# Patient Record
Sex: Male | Born: 1965 | Race: White | Marital: Married | Smoking: Never smoker
Health system: Southern US, Community
[De-identification: ages and names within clinical notes are randomized; demographics above are authoritative.]

## PROBLEM LIST (undated history)

## (undated) DIAGNOSIS — F319 Bipolar disorder, unspecified: Secondary | ICD-10-CM

## (undated) DIAGNOSIS — M25579 Pain in unspecified ankle and joints of unspecified foot: Secondary | ICD-10-CM

## (undated) DIAGNOSIS — I10 Essential (primary) hypertension: Secondary | ICD-10-CM

## (undated) DIAGNOSIS — E663 Overweight: Secondary | ICD-10-CM

## (undated) DIAGNOSIS — M25569 Pain in unspecified knee: Secondary | ICD-10-CM

## (undated) DIAGNOSIS — F32A Depression, unspecified: Secondary | ICD-10-CM

## (undated) DIAGNOSIS — N289 Disorder of kidney and ureter, unspecified: Secondary | ICD-10-CM

## (undated) DIAGNOSIS — K829 Disease of gallbladder, unspecified: Secondary | ICD-10-CM

## (undated) DIAGNOSIS — G43909 Migraine, unspecified, not intractable, without status migrainosus: Secondary | ICD-10-CM

## (undated) DIAGNOSIS — E119 Type 2 diabetes mellitus without complications: Secondary | ICD-10-CM

## (undated) DIAGNOSIS — F419 Anxiety disorder, unspecified: Secondary | ICD-10-CM

## (undated) DIAGNOSIS — G4730 Sleep apnea, unspecified: Secondary | ICD-10-CM

## (undated) DIAGNOSIS — M549 Dorsalgia, unspecified: Secondary | ICD-10-CM

## (undated) HISTORY — DX: Pain in unspecified ankle and joints of unspecified foot: M25.579

## (undated) HISTORY — DX: Sleep apnea, unspecified: G47.30

## (undated) HISTORY — DX: Type 2 diabetes mellitus without complications: E11.9

## (undated) HISTORY — PX: CHOLECYSTECTOMY: SHX55

## (undated) HISTORY — PX: CARPAL TUNNEL RELEASE: SHX101

## (undated) HISTORY — DX: Anxiety disorder, unspecified: F41.9

## (undated) HISTORY — DX: Bipolar disorder, unspecified: F31.9

## (undated) HISTORY — DX: Overweight: E66.3

## (undated) HISTORY — DX: Depression, unspecified: F32.A

## (undated) HISTORY — DX: Disorder of kidney and ureter, unspecified: N28.9

## (undated) HISTORY — DX: Pain in unspecified knee: M25.569

## (undated) HISTORY — DX: Dorsalgia, unspecified: M54.9

## (undated) HISTORY — DX: Essential (primary) hypertension: I10

## (undated) HISTORY — DX: Disease of gallbladder, unspecified: K82.9

## (undated) HISTORY — DX: Migraine, unspecified, not intractable, without status migrainosus: G43.909

---

## 2017-08-11 ENCOUNTER — Other Ambulatory Visit: Payer: Self-pay | Admitting: Orthopedic Surgery

## 2017-08-11 DIAGNOSIS — M545 Low back pain: Secondary | ICD-10-CM

## 2017-08-31 ENCOUNTER — Other Ambulatory Visit: Payer: Self-pay | Admitting: Orthopedic Surgery

## 2017-08-31 DIAGNOSIS — G8929 Other chronic pain: Secondary | ICD-10-CM

## 2017-08-31 DIAGNOSIS — Z77018 Contact with and (suspected) exposure to other hazardous metals: Secondary | ICD-10-CM

## 2017-08-31 DIAGNOSIS — M545 Low back pain: Principal | ICD-10-CM

## 2017-09-02 ENCOUNTER — Ambulatory Visit
Admission: RE | Admit: 2017-09-02 | Discharge: 2017-09-02 | Disposition: A | Discharge status: Home / Self Care | Payer: BLUE CROSS/BLUE SHIELD | Source: Ambulatory Visit | Attending: Orthopedic Surgery | Admitting: Orthopedic Surgery

## 2017-09-02 DIAGNOSIS — M545 Low back pain: Secondary | ICD-10-CM

## 2017-09-11 ENCOUNTER — Ambulatory Visit
Admission: RE | Admit: 2017-09-11 | Discharge: 2017-09-11 | Disposition: A | Discharge status: Home / Self Care | Payer: BLUE CROSS/BLUE SHIELD | Source: Ambulatory Visit | Attending: Orthopedic Surgery | Admitting: Orthopedic Surgery

## 2017-09-11 DIAGNOSIS — M545 Low back pain: Principal | ICD-10-CM

## 2017-09-11 DIAGNOSIS — G8929 Other chronic pain: Secondary | ICD-10-CM

## 2018-03-18 ENCOUNTER — Ambulatory Visit: Payer: BLUE CROSS/BLUE SHIELD | Admitting: Physician Assistant

## 2018-03-18 ENCOUNTER — Encounter: Payer: Self-pay | Admitting: Physician Assistant

## 2018-03-18 DIAGNOSIS — F319 Bipolar disorder, unspecified: Secondary | ICD-10-CM

## 2018-03-18 DIAGNOSIS — F411 Generalized anxiety disorder: Secondary | ICD-10-CM | POA: Diagnosis not present

## 2018-03-18 MED ORDER — CARBAMAZEPINE 200 MG PO TABS: 200.0000 mg | ORAL_TABLET | Freq: Two times a day (BID) | ORAL | 0 refills | Status: DC

## 2018-03-18 MED ORDER — GABAPENTIN 100 MG PO CAPS: 100.0000 mg | ORAL_CAPSULE | Freq: Three times a day (TID) | ORAL | 0 refills | Status: DC

## 2018-03-18 NOTE — Progress Notes (Signed)
[facility]  Patient ID: Gregory Howe,  MRN: [id number]  PCP: Joya [name]  Date of Evaluation: 03/18/2018 Time spent:25 minutes   HISTORY/CURRENT STATUS: HPIWent to hospital last week.Had been having increased SI. He and wife went on a cruise to [place] last and he didn't have fun because of all the 'pressure' in his head.  After they got home last week, he worked one day and then he went to ER in [place] for SI.  "That was a colossal mistake.  They kept me 27 hours and then sent me to [facility].  I was in hospital for 4 nights.They put me on Gabapentin and I feel a lot better.  The pain in my back is a lot better too. It had been hurting for months.  I felt like if I didn't go to the hospital, the urge of suicide was so bad, I would do it. The pressure of being mad had built up so bad, I couldn't deal with it. I'm not suicidal now. The dr up there kept me out of work till tomorrow I'm a little anxious about going back to work tomorrow, but [employer] ready. He feels that the pressure has been released.  Beginning group therapy really helped a lot.  He denies suicidal or homicidal thoughts at this time. He feels a little more able to enjoy things right now and his energy and motivation are better.  Is not isolating.  Not sleeping as much since he got home from the hospital, of course that is just been a few days. No increased energy with decreased need for sleep, no irritability or easy to anger, no grandiosity, no impulsivity or risky behavior, no increased libido, no increased spending. During the hospitalization he was also put on trazodone for sleep.  He states it has helped a lot but does not need it every night.  Also the Tegretol was increased to 200 mg twice a day.  (I reviewed our records and that is the dose I have documented anyway.  He states he is only been taken 300 mg total a day)    Individual Medical History/ Review of Systems: Changes? :Yes see above  Allergies:  Patient has no known allergies.  Current Medications:  Current Outpatient Medications:  .  gabapentin (NEURONTIN) 100 MG capsule, Take 1 capsule (100 mg total) by mouth 3 (three) times daily., Disp: 270 capsule, Rfl: 0 .  traZODone (DESYREL) 50 MG tablet, Take 50 mg by mouth at bedtime as needed., Disp: , Rfl:  .  ALPRAZolam (XANAX) 0.5 MG tablet, Take 0.5 mg by mouth 2 (two) times daily as needed for anxiety (1/1-1 po bid prn)., Disp: , Rfl:  .  carbamazepine (TEGRETOL) 200 MG tablet, Take 1 tablet (200 mg total) by mouth 2 (two) times daily., Disp: 180 tablet, Rfl: 0 Medication Side Effects: None  Family Medical/ Social History: Changes? No  MENTAL HEALTH EXAM:  There were no vitals taken for this visit.There is no height or weight on file to calculate BMI.  General Appearance: Well Groomed obese  Eye Contact:  Good  Speech:  Clear and Coherent  Volume:  Normal  Mood:  Depressed  Affect:  Appropriate  Thought Process:  Goal Directed  Orientation:  Full (Time, Place, and Person)  Thought Content: Logical   Suicidal Thoughts:  No  Homicidal Thoughts:  No  Memory:  Immediate  Judgement:  Good  Insight:  Good  Psychomotor Activity:  Normal  Concentration:  Concentration: Good  Recall:  Good  Fund of Knowledge: Good  Language: Good  Akathisia:  No  AIMS (if indicated): not done  Assets:  Desire for Improvement  ADL's:  Intact  Cognition: WNL  Prognosis:  Good    DIAGNOSES:    ICD-10-CM   1. Bipolar I disorder (HCC) F31.9   2. Generalized anxiety disorder F41.1     RECOMMENDATIONS: I spent 25 minutes with him in this encounter and at least 50% of that time in counseling reference diagnosis and treatment options. I am glad he is doing much better and glad he went to the hospital when needed. Continue Tegretol 200 mg 1 twice daily. Continue gabapentin 100 mg 1 3 times daily.  He is aware that we can increase the dose quite a bit if it stops working in the future, he  should let me know. Continue trazodone 50 mg nightly as needed. He has Xanax for as needed use but states he is not needing that. Requested records from hospital admission.  After I reviewed the records if there is no carbamazepine level, I will order that. Recommend psychotherapy Return in 4 weeks or sooner as needed.    Melony Overly, PA-C

## 2018-04-28 ENCOUNTER — Ambulatory Visit: Payer: BLUE CROSS/BLUE SHIELD | Admitting: Physician Assistant

## 2018-05-11 ENCOUNTER — Other Ambulatory Visit: Payer: Self-pay | Admitting: Physician Assistant

## 2018-05-13 ENCOUNTER — Encounter: Payer: Self-pay | Admitting: Emergency Medicine

## 2018-05-13 DIAGNOSIS — F319 Bipolar disorder, unspecified: Secondary | ICD-10-CM | POA: Insufficient documentation

## 2018-05-13 DIAGNOSIS — F419 Anxiety disorder, unspecified: Secondary | ICD-10-CM | POA: Insufficient documentation

## 2018-05-21 ENCOUNTER — Ambulatory Visit (INDEPENDENT_AMBULATORY_CARE_PROVIDER_SITE_OTHER): Payer: BLUE CROSS/BLUE SHIELD | Admitting: Physician Assistant

## 2018-05-21 ENCOUNTER — Encounter: Payer: Self-pay | Admitting: Physician Assistant

## 2018-05-21 DIAGNOSIS — F411 Generalized anxiety disorder: Secondary | ICD-10-CM

## 2018-05-21 DIAGNOSIS — F319 Bipolar disorder, unspecified: Secondary | ICD-10-CM

## 2018-05-21 MED ORDER — CARBAMAZEPINE 200 MG PO TABS: 200.0000 mg | ORAL_TABLET | Freq: Two times a day (BID) | ORAL | 0 refills | Status: DC

## 2018-05-21 MED ORDER — GABAPENTIN 100 MG PO CAPS: 100.0000 mg | ORAL_CAPSULE | Freq: Three times a day (TID) | ORAL | 0 refills | Status: DC

## 2018-05-21 NOTE — Progress Notes (Signed)
[facility]  Patient ID: Gregory PlattKenneth Howe,  MRN: [id number]  PCP: Gregory Howe, Gregory  Date of Evaluation: 05/21/2018 Time spent:15 minutes  Chief Complaint:  Chief Complaint    Follow-up      HISTORY/CURRENT STATUS: HPI here for routine med check.  States he continues to do fairly well.  He denies any suicidal thoughts now.  "I think that the medication is doing what it supposed to do.  I have 50 years of feeling the way I felt and I know that is not going to go away overnight.  I have seen counselors in the past but it has not seemed very helpful.  I prefer not to do that at this point."  Patient denies loss of interest in usual activities and is able to some enjoy things.  Denies decreased energy or motivation.  Appetite has not changed.  No extreme sadness, tearfulness, or feelings of hopelessness.  Denies any changes in concentration, making decisions or remembering things.  Denies suicidal or homicidal thoughts.  He does prefer to be alone, just him and his wife.  Patient denies increased energy with decreased need for sleep, no increased talkativeness, no racing thoughts, no impulsivity or risky behaviors, no increased spending, no increased libido, no grandiosity.  Anxiety is well controlled.  He has not taken the Xanax in a very long time.  He sleeps pretty well.  Work is very stressful but has not changed.  Individual Medical History/ Review of Systems: Changes? :Yes Has an upper respiratory infection right now.  No meds for it at this point.  Past medications for mental health diagnoses include: Latuda, Depakote, Lexapro, Lamictal caused headaches, Ambien, Risperdal, Xanax, Tegretol, Seroquel, Zoloft  Allergies: Lamictal [lamotrigine]  Current Medications:  Current Outpatient Medications:  .  ALPRAZolam (XANAX) 0.5 MG tablet, Take 0.5 mg by mouth 2 (two) times daily as needed for anxiety (1/1-1 po bid prn)., Disp: , Rfl:  .  carbamazepine (TEGRETOL) 200 MG  tablet, Take 1 tablet (200 mg total) by mouth 2 (two) times daily., Disp: 180 tablet, Rfl: 0 .  gabapentin (NEURONTIN) 100 MG capsule, Take 1 capsule (100 mg total) by mouth 3 (three) times daily., Disp: 270 capsule, Rfl: 0 .  Galcanezumab-gnlm (EMGALITY [place]), Inject into the skin., Disp: , Rfl:  .  traMADol (ULTRAM) 50 MG tablet, Take by mouth every 6 (six) hours as needed., Disp: , Rfl:  Medication Side Effects: none  Family Medical/ Social History: Changes? No  MENTAL HEALTH EXAM:  There were no vitals taken for this visit.There is no height or weight on file to calculate BMI.  General Appearance: Casual, Well Groomed and Obese  Eye Contact:  Good  Speech:  Clear and Coherent  Volume:  Normal  Mood:  Euthymic  Affect:  Appropriate  Thought Process:  Goal Directed  Orientation:  Full (Time, Place, and Person)  Thought Content: Logical   Suicidal Thoughts:  No  Homicidal Thoughts:  No  Memory:  WNL  Judgement:  Good  Insight:  Good  Psychomotor Activity:  Normal  Concentration:  Concentration: Good  Recall:  Good  Fund of Knowledge: Good  Language: Good  Assets:  Desire for Improvement  ADL's:  Intact  Cognition: WNL  Prognosis:  Good    DIAGNOSES:    ICD-10-CM   1. Bipolar I disorder (HCC) F31.9   2. Generalized anxiety disorder F41.1     Receiving Psychotherapy: No    RECOMMENDATIONS: Continue current treatment. Consider seeing a counselor again. Return  in 3 months or sooner as needed.  Gregory Overlyeresa Jalessa Peyser, PA-C

## 2018-07-31 ENCOUNTER — Other Ambulatory Visit: Payer: Self-pay | Admitting: Physician Assistant

## 2018-08-18 ENCOUNTER — Other Ambulatory Visit: Payer: Self-pay

## 2018-08-18 ENCOUNTER — Ambulatory Visit (INDEPENDENT_AMBULATORY_CARE_PROVIDER_SITE_OTHER): Payer: BLUE CROSS/BLUE SHIELD | Admitting: Physician Assistant

## 2018-08-18 ENCOUNTER — Encounter: Payer: Self-pay | Admitting: Physician Assistant

## 2018-08-18 DIAGNOSIS — F319 Bipolar disorder, unspecified: Secondary | ICD-10-CM

## 2018-08-18 DIAGNOSIS — F411 Generalized anxiety disorder: Secondary | ICD-10-CM

## 2018-08-18 DIAGNOSIS — Z79899 Other long term (current) drug therapy: Secondary | ICD-10-CM | POA: Diagnosis not present

## 2018-08-18 MED ORDER — CARBAMAZEPINE 200 MG PO TABS: 200.0000 mg | ORAL_TABLET | Freq: Two times a day (BID) | ORAL | 1 refills | Status: DC

## 2018-08-18 MED ORDER — GABAPENTIN 100 MG PO CAPS: ORAL_CAPSULE | ORAL | 1 refills | Status: DC

## 2018-08-18 NOTE — Progress Notes (Signed)
[facility]  Patient ID: Gregory Howe,  MRN: [id number]  PCP: Joya [name]  Date of Evaluation: 08/18/2018 Time spent:15 minutes  Chief Complaint:  Chief Complaint    Follow-up      HISTORY/CURRENT STATUS: HPI Here for routine med check.    The only real problem he is having is a little more pain in his back and with the migraines.  He has been prescribed gabapentin 300 mg total a day but it does not seem to be working as well.  That was originally given by another provider but I wrote his last prescription.  He wonders if increasing the dose a little might help with the pain.  The pain is usually worse on days that he has to go into work.  He has a headache every morning before he gets to work.  It has helped with his anxiety to and he rarely needs the Xanax.   Patient denies loss of interest in usual activities and is able to enjoy things.  Denies decreased energy or motivation.  Appetite has not changed.  No extreme sadness, tearfulness, or feelings of hopelessness.  Denies any changes in concentration, making decisions or remembering things.  Denies suicidal or homicidal thoughts.  Patient denies increased energy with decreased need for sleep, no increased talkativeness, no racing thoughts, no impulsivity or risky behaviors, no increased spending, no increased libido, no grandiosity.  He does have some irritability still but again seems to be more work-related and it is not as bad as it was in the past, like last year.  Denies muscle stiffness, or dystonia.  Denies dizziness, syncope, seizures, numbness, tingling, tremor, tics, unsteady gait, slurred speech, confusion.   Individual Medical History/ Review of Systems: Changes? :No    Past medications for mental health diagnoses include: Latuda, Depakote, Lexapro, Lamictal, Ambien, Risperdal, Xanax, Tegretol, Seroquel caused severe sedation, Zoloft  Allergies: Lamictal [lamotrigine]  Current Medications:   Current Outpatient Medications:  .  ALPRAZolam (XANAX) 0.5 MG tablet, Take 0.5 mg by mouth 2 (two) times daily as needed for anxiety (1/1-1 po bid prn)., Disp: , Rfl:  .  carbamazepine (TEGRETOL) 200 MG tablet, Take 1 tablet (200 mg total) by mouth 2 (two) times daily., Disp: 180 tablet, Rfl: 1 .  gabapentin (NEURONTIN) 100 MG capsule, 2 po q am, 1 po around lunch, 1 po q hs., Disp: 360 capsule, Rfl: 1 .  Galcanezumab-gnlm (EMGALITY [place]), Inject into the skin., Disp: , Rfl:  .  lisinopril (PRINIVIL,ZESTRIL) 5 MG tablet, , Disp: , Rfl:  .  metoprolol tartrate (LOPRESSOR) 50 MG tablet, , Disp: , Rfl:  .  SUMAtriptan (IMITREX) 100 MG tablet, TAKE ONE TABLET BY MOUTH WITH HEADACHE MAY REPEAT IN 2 HOURS MAX OF 2 TABLETS IN 24 HOURS, Disp: , Rfl:  .  traMADol (ULTRAM) 50 MG tablet, Take by mouth every 6 (six) hours as needed., Disp: , Rfl:  Medication Side Effects: none  Family Medical/ Social History: Changes? No  MENTAL HEALTH EXAM:  There were no vitals taken for this visit.There is no height or weight on file to calculate BMI.  General Appearance: Casual and Well Groomed  Eye Contact:  Good  Speech:  Clear and Coherent  Volume:  Normal  Mood:  Euthymic  Affect:  Appropriate  Thought Process:  Goal Directed  Orientation:  Full (Time, Place, and Person)  Thought Content: Logical   Suicidal Thoughts:  No  Homicidal Thoughts:  No  Memory:  WNL  Judgement:  Good  Insight:  Good  Psychomotor Activity:  Normal  Concentration:  Concentration: Good  Recall:  Good  Fund of Knowledge: Good  Language: Good  Assets:  Desire for Improvement  ADL's:  Intact  Cognition: WNL  Prognosis:  Good    DIAGNOSES:    ICD-10-CM   1. Bipolar I disorder (HCC) F31.9   2. Encounter for long-term (current) use of medications Z79.899 Carbamazepine level, total    Comprehensive metabolic panel  3. Generalized anxiety disorder F41.1     Receiving Psychotherapy: No    RECOMMENDATIONS: Increase  gabapentin 100 mg to 2 p.o. every morning when needed and then 1 at lunch and one at supper or bedtime as usual. Continue Tegretol 200 mg p.o. twice daily. Continue Xanax 0.5 mg twice daily as needed.  He rarely takes this. Continue Emgality, Imitrex, Ultram from PCP. Order for carbamazepine level and CMP to be done by his PCP when he has his physical done in the spring or summer. Return in 6 months.  Melony Overly, PA-C   This record has been created using [employer].  Chart creation errors have been sought, but may not always have been located and corrected. Such creation errors do not reflect on the standard of medical care.

## 2019-02-16 ENCOUNTER — Ambulatory Visit: Payer: BLUE CROSS/BLUE SHIELD | Admitting: Physician Assistant

## 2019-03-16 ENCOUNTER — Ambulatory Visit: Payer: BC Managed Care – PPO | Admitting: Physician Assistant

## 2019-04-15 ENCOUNTER — Ambulatory Visit: Payer: BC Managed Care – PPO | Admitting: Physician Assistant

## 2019-04-21 ENCOUNTER — Other Ambulatory Visit: Payer: Self-pay | Admitting: Physician Assistant

## 2019-04-22 NOTE — Telephone Encounter (Signed)
Needs apt

## 2019-05-17 ENCOUNTER — Ambulatory Visit (INDEPENDENT_AMBULATORY_CARE_PROVIDER_SITE_OTHER): Payer: BC Managed Care – PPO | Admitting: Physician Assistant

## 2019-05-17 ENCOUNTER — Encounter: Payer: Self-pay | Admitting: Physician Assistant

## 2019-05-17 ENCOUNTER — Other Ambulatory Visit: Payer: Self-pay

## 2019-05-17 DIAGNOSIS — F319 Bipolar disorder, unspecified: Secondary | ICD-10-CM | POA: Diagnosis not present

## 2019-05-17 DIAGNOSIS — F411 Generalized anxiety disorder: Secondary | ICD-10-CM

## 2019-05-17 MED ORDER — CARBAMAZEPINE 200 MG PO TABS: 200.0000 mg | ORAL_TABLET | Freq: Two times a day (BID) | ORAL | 1 refills | Status: DC

## 2019-05-17 MED ORDER — ALPRAZOLAM 0.5 MG PO TABS: 0.5000 mg | ORAL_TABLET | Freq: Two times a day (BID) | ORAL | 1 refills | Status: DC | PRN

## 2019-05-17 NOTE — Progress Notes (Signed)
[facility]  Patient ID: Gregory Howe,  MRN: [id number]  PCP: Joya [name]  Date of Evaluation: 05/17/2019 Time spent:15 minutes  Chief Complaint:  Chief Complaint    Depression; Anxiety; Follow-up      HISTORY/CURRENT STATUS: HPI For routine med check.  Doing well overall. Feels like the meds are working well.  He rarely needs the Xanax but does need a new prescription.  Work is about the same, stressful but tolerable.  He works at [employer].  Is out right now on medical leave.  He sleeps well most of the time.  Patient denies loss of interest in usual activities and is able to enjoy things.  Denies decreased energy or motivation.  Appetite has not changed.  No extreme sadness, tearfulness, or feelings of hopelessness.  Denies any changes in concentration, making decisions or remembering things.  Denies suicidal or homicidal thoughts.  Patient denies increased energy with decreased need for sleep, no increased talkativeness, no racing thoughts, no impulsivity or risky behaviors, no increased spending, no increased libido, no grandiosity.  Denies dizziness, syncope, seizures, numbness, tingling, tremor, tics, unsteady gait, slurred speech, confusion. Denies muscle or joint pain, stiffness, or dystonia.  Individual Medical History/ Review of Systems: Changes? :Yes  Had gallbladder out and hernia repair on 04/20/2019. He is out of work until Jan b/c of recent surgery.    Past medications for mental health diagnoses include: Latuda, Depakote, Lexapro, Lamictal, Ambien, Risperdal, Xanax, Tegretol, Seroquel caused severe sedation, Zoloft  Allergies: Lamictal [lamotrigine]  Current Medications:  Current Outpatient Medications:  .  ALPRAZolam (XANAX) 0.5 MG tablet, Take 1 tablet (0.5 mg total) by mouth 2 (two) times daily as needed for anxiety (1/1-1 po bid prn)., Disp: 60 tablet, Rfl: 1 .  carbamazepine (TEGRETOL) 200 MG tablet, Take 1 tablet (200 mg total) by mouth 2  (two) times daily., Disp: 180 tablet, Rfl: 1 .  gabapentin (NEURONTIN) 100 MG capsule, TAKE 2 CAPSULES BY MOUTH IN THE MORNING , 1 CAPSULE  AROUND LUNCH, AND 1 CAPSULE AT BEDTIME, Disp: 360 capsule, Rfl: 0 .  Galcanezumab-gnlm (EMGALITY [place]), Inject into the skin., Disp: , Rfl:  .  lisinopril (PRINIVIL,ZESTRIL) 5 MG tablet, , Disp: , Rfl:  .  metoprolol tartrate (LOPRESSOR) 50 MG tablet, , Disp: , Rfl:  .  SUMAtriptan (IMITREX) 100 MG tablet, TAKE ONE TABLET BY MOUTH WITH HEADACHE MAY REPEAT IN 2 HOURS MAX OF 2 TABLETS IN 24 HOURS, Disp: , Rfl:  .  traMADol (ULTRAM) 50 MG tablet, Take by mouth every 6 (six) hours as needed., Disp: , Rfl:  Medication Side Effects: none  Family Medical/ Social History: Changes? No  MENTAL HEALTH EXAM:  There were no vitals taken for this visit.There is no height or weight on file to calculate BMI.  General Appearance: Casual, Neat, Well Groomed and Obese  Eye Contact:  Good  Speech:  Clear and Coherent  Volume:  Normal  Mood:  Euthymic  Affect:  Appropriate  Thought Process:  Goal Directed and Descriptions of Associations: Intact  Orientation:  Full (Time, Place, and Person)  Thought Content: Logical   Suicidal Thoughts:  No  Homicidal Thoughts:  No  Memory:  WNL  Judgement:  Good  Insight:  Good  Psychomotor Activity:  Normal  Concentration:  Concentration: Good  Recall:  Good  Fund of Knowledge: Good  Language: Good  Assets:  Desire for Improvement  ADL's:  Intact  Cognition: WNL  Prognosis:  Good    DIAGNOSES:  ICD-10-CM   1. Bipolar I disorder ([place])  F31.9   2. Generalized anxiety disorder  F41.1     Receiving Psychotherapy: No    RECOMMENDATIONS:  Continue Xanax 0.5 mg, 1/2-1 twice daily as needed. Continue Tegretol 200 mg, 1 p.o. twice daily. Continue gabapentin 100 mg, 2 p.o. every morning, 1 p.o. at lunch, 1 p.o. at bedtime. Return in 6 months.  Donnal Moat, PA-C

## 2019-08-24 ENCOUNTER — Other Ambulatory Visit: Payer: Self-pay | Admitting: Physician Assistant

## 2019-09-12 ENCOUNTER — Other Ambulatory Visit: Payer: Self-pay

## 2019-09-12 MED ORDER — GABAPENTIN 100 MG PO CAPS: ORAL_CAPSULE | ORAL | 3 refills | Status: DC

## 2019-09-19 ENCOUNTER — Telehealth: Payer: Self-pay | Admitting: Physician Assistant

## 2019-09-19 ENCOUNTER — Other Ambulatory Visit: Payer: Self-pay | Admitting: Physician Assistant

## 2019-09-19 MED ORDER — GABAPENTIN 100 MG PO CAPS: ORAL_CAPSULE | ORAL | 0 refills | Status: DC

## 2019-09-19 NOTE — Telephone Encounter (Signed)
I sent it in.  Gave a 1 month supply b/c I figure it would be same price as enough for 5 days.

## 2019-09-19 NOTE — Telephone Encounter (Signed)
Gregory Howe called to request a local 1 wk supply of his gabapentin.  [facility] has messed up his order and he won't get it until Friday at the earliest.  He has already been out and can't wait any longer for this medication to come in the mail.  States that because he should get by the end of the week and only needs a week supply.  Please send to the [facility] on [address], in [place], [place] [phone].

## 2019-10-27 ENCOUNTER — Other Ambulatory Visit: Payer: Self-pay | Admitting: Physician Assistant

## 2019-10-28 NOTE — Telephone Encounter (Signed)
Apt 06/14

## 2019-11-14 ENCOUNTER — Encounter: Payer: Self-pay | Admitting: Physician Assistant

## 2019-11-14 ENCOUNTER — Ambulatory Visit (INDEPENDENT_AMBULATORY_CARE_PROVIDER_SITE_OTHER): Payer: BC Managed Care – PPO | Admitting: Physician Assistant

## 2019-11-14 ENCOUNTER — Other Ambulatory Visit: Payer: Self-pay

## 2019-11-14 DIAGNOSIS — F411 Generalized anxiety disorder: Secondary | ICD-10-CM

## 2019-11-14 DIAGNOSIS — Z79899 Other long term (current) drug therapy: Secondary | ICD-10-CM

## 2019-11-14 DIAGNOSIS — F319 Bipolar disorder, unspecified: Secondary | ICD-10-CM | POA: Diagnosis not present

## 2019-11-14 MED ORDER — GABAPENTIN 100 MG PO CAPS: ORAL_CAPSULE | ORAL | 3 refills | Status: DC

## 2019-11-14 MED ORDER — ALPRAZOLAM 0.5 MG PO TABS: 0.5000 mg | ORAL_TABLET | Freq: Two times a day (BID) | ORAL | 1 refills | Status: DC | PRN

## 2019-11-14 MED ORDER — CARBAMAZEPINE 200 MG PO TABS: 200.0000 mg | ORAL_TABLET | Freq: Two times a day (BID) | ORAL | 3 refills | Status: DC

## 2019-11-14 NOTE — Progress Notes (Signed)
[facility]  Patient ID: Gregory Howe,  MRN: [id number]  PCP: Joya [name]  Date of Evaluation: 11/14/2019 Time spent:20 minutes  Chief Complaint:  Chief Complaint    Medication Refill      HISTORY/CURRENT STATUS: HPI For routine med check.  Says he's about the same.  He does not get excited about much of anything, and feels like he is just going to work to pay the bills.  He is able to work, not missing days. Denies decreased energy or motivation.  He does not feel hopeless but just does not care.  States he is not suicidal but if he did not wake up tomorrow morning, he would not care.  Says the medications are working as well as they can and he does not feel like anything that he is tried works any better.    He does sleep well and anxiety is controlled.  Work stresses him out sometimes but that is par for the course.  Xanax still helps when he needs it.  Patient denies increased energy with decreased need for sleep, no increased talkativeness, no racing thoughts, no impulsivity or risky behaviors, no increased spending, no increased libido, no grandiosity.  Denies dizziness, syncope, seizures, numbness, tingling, tremor, tics, unsteady gait, slurred speech, confusion. Denies muscle or joint pain, stiffness, or dystonia.  Individual Medical History/ Review of Systems: Changes? :No    Past medications for mental health diagnoses include: Latuda, Depakote, Lexapro, Lamictal, Ambien, Risperdal, Xanax, Tegretol, Seroquel caused severe sedation, Zoloft  Allergies: Lamictal [lamotrigine]  Current Medications:  Current Outpatient Medications:  .  ALPRAZolam (XANAX) 0.5 MG tablet, Take 1 tablet (0.5 mg total) by mouth 2 (two) times daily as needed. for anxiety, Disp: 180 tablet, Rfl: 1 .  carbamazepine (TEGRETOL) 200 MG tablet, Take 1 tablet (200 mg total) by mouth 2 (two) times daily., Disp: 180 tablet, Rfl: 3 .  gabapentin (NEURONTIN) 100 MG capsule, Take 2 capsules  (200 mg) in the morning, and 1 capsule (100 mg) at bedtime., Disp: 270 capsule, Rfl: 3 .  Galcanezumab-gnlm (EMGALITY [place]), Inject into the skin., Disp: , Rfl:  .  lisinopril (PRINIVIL,ZESTRIL) 5 MG tablet, , Disp: , Rfl:  .  MELOXICAM PO, Take by mouth., Disp: , Rfl:  .  metoprolol tartrate (LOPRESSOR) 50 MG tablet, , Disp: , Rfl:  .  SUMAtriptan (IMITREX) 100 MG tablet, TAKE ONE TABLET BY MOUTH WITH HEADACHE MAY REPEAT IN 2 HOURS MAX OF 2 TABLETS IN 24 HOURS, Disp: , Rfl:  .  traMADol (ULTRAM) 50 MG tablet, Take by mouth every 6 (six) hours as needed., Disp: , Rfl:  Medication Side Effects: none  Family Medical/ Social History: Changes? No  MENTAL HEALTH EXAM:  There were no vitals taken for this visit.There is no height or weight on file to calculate BMI.  General Appearance: Casual, Neat, Well Groomed and Obese  Eye Contact:  Good  Speech:  Clear and Coherent  Volume:  Normal  Mood:  Euthymic  Affect:  Appropriate  Thought Process:  Goal Directed and Descriptions of Associations: Intact  Orientation:  Full (Time, Place, and Person)  Thought Content: Logical   Suicidal Thoughts:  No  Homicidal Thoughts:  No  Memory:  WNL  Judgement:  Good  Insight:  Good  Psychomotor Activity:  Normal  Concentration:  Concentration: Good  Recall:  Good  Fund of Knowledge: Good  Language: Good  Assets:  Desire for Improvement  ADL's:  Intact  Cognition: WNL  Prognosis:  Good    DIAGNOSES:    ICD-10-CM   1. Bipolar I disorder ([place])  F31.9   2. Generalized anxiety disorder  F41.1   3. Encounter for long-term (current) use of medications  Z79.899 Carbamazepine level, total    Comprehensive metabolic panel    CBC with Differential/Platelet    Receiving Psychotherapy: No    RECOMMENDATIONS:  PDMP reviewed.  We discussed different meds but he prefers to keep things the same for now.  He knows to let me know if he should get worse in any way, prior to the next visit.  Contract for  safety is in place.  He knows to go to the emergency room if he starts having suicidal thoughts. Continue Xanax 0.5 mg, 1/2-1 twice daily as needed. Continue Tegretol 200 mg, 1 p.o. twice daily. Continue gabapentin 100 mg, 2 p.o. every morning, 1 p.o. at bedtime. Labs ordered as above.  Stressed importance of having them done. Return in 6 months.  Donnal Moat, PA-C

## 2019-11-18 IMAGING — CT CT BIOPSY
1 of 6 series · 3 of 16 positions shown, 4 images · non-contrast
Comparison: none

CLINICAL DATA: Left pelvic pain.  Question sacroiliitis.

[Series 4: needle -guided injection · axial · 0.87mm/px · z∈[-590,-538]mm · 3 of 27 slices shown, 4 images]
[im 1/27  soft-tissue]
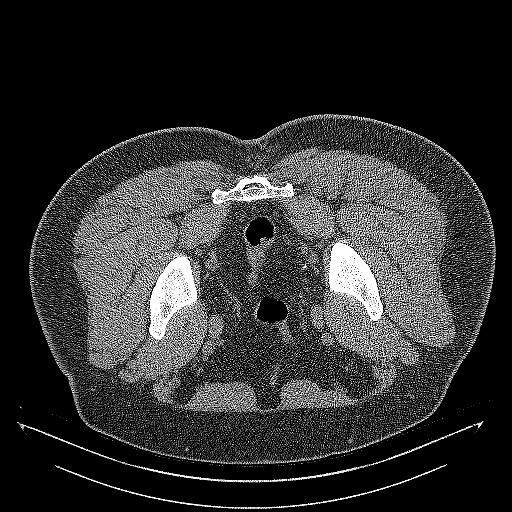
[im 1/27  bone]
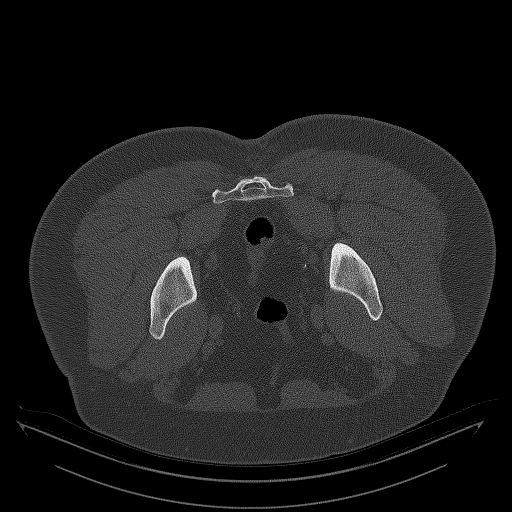
[im 14/27  soft-tissue]
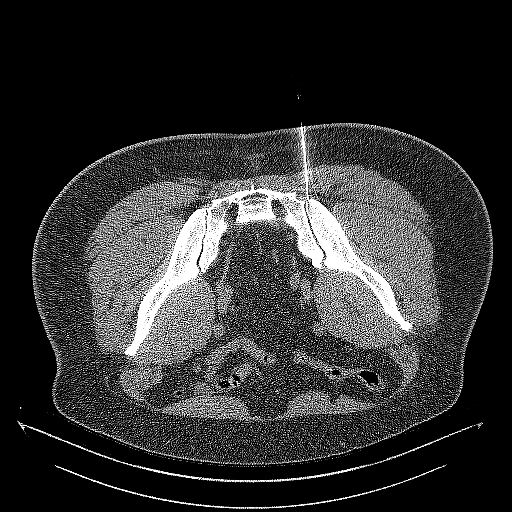
[im 27/27  soft-tissue]
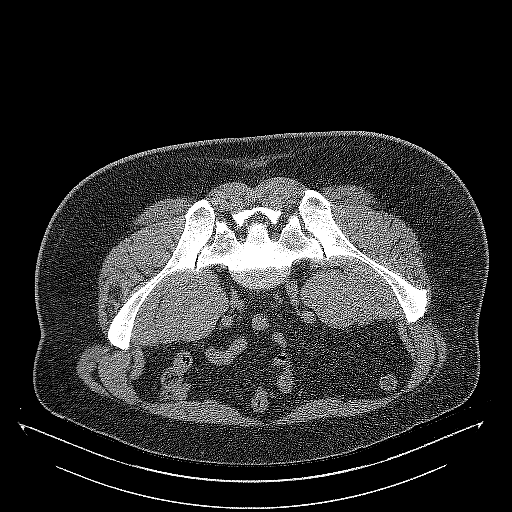

[3 of 16 positions shown; findings below may reference images not displayed]

EXAM:
Left CT GUIDED SI JOINT INJECTION



After local anesthesia with 1% lidocaine without epinephrine and
subsequent deep anesthesia, a 22 gauge spinal needle was advanced
into the left SI joint under intermittent CT guidance.

Once the needle was in satisfactory position, representative image
was captured with the needle demonstrated in the sacroiliac joint.
Subsequently, 2.5 mL 1% lidocaine was injected into the left SI
joint. Needles removed and a sterile dressing applied.

No complications were observed.
IMPRESSION: Successful CT-guided left SI joint injection.

## 2020-05-10 ENCOUNTER — Other Ambulatory Visit: Payer: Self-pay

## 2020-05-10 MED ORDER — GABAPENTIN 100 MG PO CAPS: ORAL_CAPSULE | ORAL | 0 refills | Status: DC

## 2020-05-14 ENCOUNTER — Ambulatory Visit (INDEPENDENT_AMBULATORY_CARE_PROVIDER_SITE_OTHER): Payer: BC Managed Care – PPO | Admitting: Physician Assistant

## 2020-05-14 ENCOUNTER — Encounter: Payer: Self-pay | Admitting: Physician Assistant

## 2020-05-14 ENCOUNTER — Other Ambulatory Visit: Payer: Self-pay

## 2020-05-14 DIAGNOSIS — Z79899 Other long term (current) drug therapy: Secondary | ICD-10-CM

## 2020-05-14 DIAGNOSIS — F319 Bipolar disorder, unspecified: Secondary | ICD-10-CM | POA: Diagnosis not present

## 2020-05-14 DIAGNOSIS — F411 Generalized anxiety disorder: Secondary | ICD-10-CM | POA: Diagnosis not present

## 2020-05-14 MED ORDER — ALPRAZOLAM 0.5 MG PO TABS: 0.5000 mg | ORAL_TABLET | Freq: Two times a day (BID) | ORAL | 1 refills | Status: DC | PRN

## 2020-05-14 MED ORDER — GABAPENTIN 100 MG PO CAPS: 200.0000 mg | ORAL_CAPSULE | Freq: Two times a day (BID) | ORAL | 1 refills | Status: DC

## 2020-05-14 MED ORDER — GABAPENTIN 100 MG PO CAPS: 200.0000 mg | ORAL_CAPSULE | Freq: Two times a day (BID) | ORAL | 0 refills | Status: DC

## 2020-05-14 NOTE — Progress Notes (Signed)
[facility]  Patient ID: Gregory Howe,  MRN: [id number]  PCP: Joya [name]  Date of Evaluation: 05/14/2020 Time spent:30 minutes  Chief Complaint:  Chief Complaint    Anxiety; Depression      HISTORY/CURRENT STATUS:  For routine med check.  States he's doing pretty well. Has intensified emotions, no matter what that emotion is. Is able to enjoy things, energy and motivation are ok.  He does not cry easily.  He sleeps well with the Xanax.  If he does not take it, he has more trouble sleeping.  Personal hygiene is normal.  Work is going okay but is still stressful.  Works at [employer].  He does take the Xanax at work but not every day.  He feels too groggy if he takes it much.  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  No suicidal or homicidal thoughts.  Patient denies increased energy with decreased need for sleep, no increased talkativeness, no racing thoughts, no impulsivity or risky behaviors, no increased spending, no increased libido, no grandiosity, no paranoia, and no AH/HI.   States his wife gives him his medications and has been giving him 2 of the gabapentin twice a day instead of a total of 3 pills daily.  He has been feeling fine with it.  It has seemed to help the anxiety some.  He still has not had labs drawn.  Denies dizziness, syncope, seizures, numbness, tingling, tremor, tics, unsteady gait, slurred speech, confusion. Denies muscle or joint pain, stiffness, or dystonia.  Individual Medical History/ Review of Systems: Changes? :No    Past medications for mental health diagnoses include: Latuda, Depakote, Lexapro, Lamictal, Ambien, Risperdal, Xanax, Tegretol, Seroquel caused severe sedation, Zoloft  Allergies: Lamictal [lamotrigine]  Current Medications:  Current Outpatient Medications:  .  carbamazepine (TEGRETOL) 200 MG tablet, Take 1 tablet (200 mg total) by mouth 2 (two) times daily., Disp: 180 tablet, Rfl: 3 .   Galcanezumab-gnlm (EMGALITY [place]), Inject into the skin., Disp: , Rfl:  .  lisinopril (PRINIVIL,ZESTRIL) 5 MG tablet, , Disp: , Rfl:  .  MELOXICAM PO, Take by mouth., Disp: , Rfl:  .  metoprolol tartrate (LOPRESSOR) 50 MG tablet, , Disp: , Rfl:  .  traMADol (ULTRAM) 50 MG tablet, Take by mouth every 6 (six) hours as needed., Disp: , Rfl:  .  ALPRAZolam (XANAX) 0.5 MG tablet, Take 1 tablet (0.5 mg total) by mouth 2 (two) times daily as needed. for anxiety, Disp: 180 tablet, Rfl: 1 .  gabapentin (NEURONTIN) 100 MG capsule, Take 2 capsules (200 mg total) by mouth 2 (two) times daily., Disp: 360 capsule, Rfl: 1 .  gabapentin (NEURONTIN) 100 MG capsule, Take 2 capsules (200 mg total) by mouth 2 (two) times daily., Disp: 120 capsule, Rfl: 0 .  SUMAtriptan (IMITREX) 100 MG tablet, TAKE ONE TABLET BY MOUTH WITH HEADACHE MAY REPEAT IN 2 HOURS MAX OF 2 TABLETS IN 24 HOURS (Patient not taking: Reported on 05/14/2020), Disp: , Rfl:  Medication Side Effects: none  Family Medical/ Social History: Changes? No  MENTAL HEALTH EXAM:  There were no vitals taken for this visit.There is no height or weight on file to calculate BMI.  General Appearance: Casual, Neat, Well Groomed and Obese  Eye Contact:  Good  Speech:  Clear and Coherent  Volume:  Normal  Mood:  Euthymic  Affect:  Appropriate  Thought Process:  Goal Directed and Descriptions of Associations: Intact  Orientation:  Full (Time, Place, and Person)  Thought Content: Logical   Suicidal Thoughts:  No  Homicidal Thoughts:  No  Memory:  WNL  Judgement:  Good  Insight:  Good  Psychomotor Activity:  Normal  Concentration:  Concentration: Good  Recall:  Good  Fund of Knowledge: Good  Language: Good  Assets:  Desire for Improvement  ADL's:  Intact  Cognition: WNL  Prognosis:  Good    DIAGNOSES:    ICD-10-CM   1. Bipolar I disorder (HCC)  F31.9   2. Encounter for long-term (current) use of medications  Z79.899 CBC with Differential/Platelet     Comprehensive metabolic panel    Carbamazepine level, total  3. Generalized anxiety disorder  F41.1     Receiving Psychotherapy: No    RECOMMENDATIONS:  PDMP reviewed.  I provided 30 mins of face to face time during this encounter.  Again discussed the importance of having labs done.  Carbamazepine can affect the liver, so LFTs are very important.  I am reordering those labs.  Patient is aware of these possible effects and accepts any consequences of not having labs drawn.  He did have a physical recently but not sure what labs were drawn.  I am unable to see them in epic. Continue Xanax 0.5 mg, 1/2-1 twice daily as needed. Continue Tegretol 200 mg, 1 p.o. twice daily. Continue gabapentin 100 mg, 2 p.o. twice daily.   Labs ordered as above. Return in 6 months.  Melony Overly, PA-C

## 2020-05-31 LAB — CBC WITH DIFFERENTIAL/PLATELET
Basophils Absolute: 0.1 10*3/uL (ref 0.0–0.2)
Basos: 1 %
EOS (ABSOLUTE): 0.2 10*3/uL (ref 0.0–0.4)
Eos: 3 %
Hematocrit: 45.4 % (ref 37.5–51.0)
Hemoglobin: 15.7 g/dL (ref 13.0–17.7)
Immature Grans (Abs): 0 10*3/uL (ref 0.0–0.1)
Immature Granulocytes: 0 %
Lymphocytes Absolute: 1.6 10*3/uL (ref 0.7–3.1)
Lymphs: 24 %
MCH: 31.4 pg (ref 26.6–33.0)
MCHC: 34.6 g/dL (ref 31.5–35.7)
MCV: 91 fL (ref 79–97)
Monocytes Absolute: 0.6 10*3/uL (ref 0.1–0.9)
Monocytes: 8 %
Neutrophils Absolute: 4.5 10*3/uL (ref 1.4–7.0)
Neutrophils: 64 %
Platelets: 206 10*3/uL (ref 150–450)
RBC: 5 x10E6/uL (ref 4.14–5.80)
RDW: 12.3 % (ref 11.6–15.4)
WBC: 7 10*3/uL (ref 3.4–10.8)

## 2020-05-31 LAB — COMPREHENSIVE METABOLIC PANEL
ALT: 37 IU/L (ref 0–44)
AST: 23 IU/L (ref 0–40)
Albumin/Globulin Ratio: 2.3 — ABNORMAL HIGH (ref 1.2–2.2)
Albumin: 4.6 g/dL (ref 3.8–4.9)
Alkaline Phosphatase: 64 IU/L (ref 44–121)
BUN/Creatinine Ratio: 14 (ref 9–20)
BUN: 16 mg/dL (ref 6–24)
Bilirubin Total: <0.2 mg/dL (ref 0.0–1.2)
CO2: 26 mmol/L (ref 20–29)
Calcium: 9.8 mg/dL (ref 8.7–10.2)
Chloride: 99 mmol/L (ref 96–106)
Creatinine, Ser: 1.15 mg/dL (ref 0.76–1.27)
GFR calc Af Amer: 84 mL/min/{1.73_m2} (ref >59)
GFR calc non Af Amer: 72 mL/min/{1.73_m2} (ref >59)
Globulin, Total: 2 g/dL (ref 1.5–4.5)
Glucose: 124 mg/dL — ABNORMAL HIGH (ref 65–99)
Potassium: 4.6 mmol/L (ref 3.5–5.2)
Sodium: 139 mmol/L (ref 134–144)
Total Protein: 6.6 g/dL (ref 6.0–8.5)

## 2020-05-31 LAB — CARBAMAZEPINE LEVEL, TOTAL: Carbamazepine (Tegretol), S: 8.1 ug/mL (ref 4.0–12.0)

## 2020-06-05 NOTE — Progress Notes (Signed)
CBC is within normal limits, glucose 124, otherwise normal CMP.  If the blood sugar is fasting, then he does need to see his PCP about it.  If it was random, this is normal.  Carbamazepine level was 8.1.  No change in medication doses. I did not reach the patient, left a message on voicemail asking him to call back concerning labs.

## 2020-06-06 ENCOUNTER — Telehealth: Payer: Self-pay | Admitting: Physician Assistant

## 2020-06-06 NOTE — Telephone Encounter (Signed)
Pt lm stating per your request to call him so he can discuss his lab results with you. # [id number]

## 2020-06-07 NOTE — Telephone Encounter (Signed)
Would you please give him a call with lab results.  Thank you.

## 2020-06-07 NOTE — Telephone Encounter (Signed)
Patient aware.

## 2020-10-11 ENCOUNTER — Other Ambulatory Visit: Payer: Self-pay | Admitting: Physician Assistant

## 2020-10-12 NOTE — Telephone Encounter (Signed)
Please review

## 2020-11-05 ENCOUNTER — Other Ambulatory Visit: Payer: Self-pay | Admitting: Physician Assistant

## 2020-11-06 NOTE — Telephone Encounter (Signed)
Last filled 08/06/20

## 2020-11-12 ENCOUNTER — Other Ambulatory Visit: Payer: Self-pay

## 2020-11-12 ENCOUNTER — Ambulatory Visit (INDEPENDENT_AMBULATORY_CARE_PROVIDER_SITE_OTHER): Payer: BC Managed Care – PPO | Admitting: Physician Assistant

## 2020-11-12 ENCOUNTER — Encounter: Payer: Self-pay | Admitting: Physician Assistant

## 2020-11-12 DIAGNOSIS — F319 Bipolar disorder, unspecified: Secondary | ICD-10-CM

## 2020-11-12 DIAGNOSIS — F411 Generalized anxiety disorder: Secondary | ICD-10-CM | POA: Diagnosis not present

## 2020-11-12 MED ORDER — GABAPENTIN 300 MG PO CAPS: 300.0000 mg | ORAL_CAPSULE | Freq: Two times a day (BID) | ORAL | 1 refills | Status: DC

## 2020-11-12 NOTE — Progress Notes (Signed)
[facility]  Patient ID: Gregory Howe,  MRN: [id number]  PCP: Joya [name]  Date of Evaluation: 11/12/2020 Time spent:30 minutes  Chief Complaint:  Chief Complaint   Anxiety; Depression; Follow-up      HISTORY/CURRENT STATUS: For routine med check.  Doing about the same. Anxiety is really bad, especially when he goes to work. Good Year is very stressful. Xanax does help, but does make him sleepy if he takes it in the morning, when he needs it most. Takes it at night usually and it helps with relaxation and helps sleep.   Doesn't really enjoy things, but not changing things to help himself, he states."I don't think the answer is a pill." Energy and motivation are fair. Doesn't miss work. Not isolating. Not crying easily. Does have passive SI. No plan. "I'm not going to do anything but there are a lot of days that I wish I was not here."  No suicidal thoughts.  Patient denies increased energy with decreased need for sleep, no increased talkativeness, no racing thoughts, no impulsivity or risky behaviors, no increased spending, no increased libido, no grandiosity, no increased irritability or anger, and no hallucinations.  Denies dizziness, syncope, seizures, numbness, tingling, tremor, tics, unsteady gait, slurred speech, confusion. Denies muscle or joint pain, stiffness, or dystonia.  Individual Medical History/ Review of Systems: Changes? :No    Past medications for mental health diagnoses include: Latuda, Depakote, Lexapro, Lamictal, Ambien, Risperdal, Xanax, Tegretol, Seroquel caused severe sedation, Zoloft  Allergies: Lamictal [lamotrigine]  Current Medications:  Current Outpatient Medications:    ALPRAZolam (XANAX) 0.5 MG tablet, TAKE 1 TABLET BY MOUTH  TWICE DAILY AS NEEDED. FOR  ANXIETY, Disp: 180 tablet, Rfl: 0   carbamazepine (TEGRETOL) 200 MG tablet, TAKE 1 TABLET BY MOUTH  TWICE DAILY, Disp: 180 tablet, Rfl: 0   gabapentin (NEURONTIN) 300 MG capsule,  Take 1 capsule (300 mg total) by mouth 2 (two) times daily., Disp: 180 capsule, Rfl: 1   Galcanezumab-gnlm (EMGALITY [place]), Inject into the skin., Disp: , Rfl:    lisinopril (PRINIVIL,ZESTRIL) 5 MG tablet, , Disp: , Rfl:    MELOXICAM PO, Take by mouth., Disp: , Rfl:    metoprolol tartrate (LOPRESSOR) 50 MG tablet, , Disp: , Rfl:    traMADol (ULTRAM) 50 MG tablet, Take by mouth every 6 (six) hours as needed., Disp: , Rfl:    SUMAtriptan (IMITREX) 100 MG tablet, TAKE ONE TABLET BY MOUTH WITH HEADACHE MAY REPEAT IN 2 HOURS MAX OF 2 TABLETS IN 24 HOURS (Patient not taking: No sig reported), Disp: , Rfl:  Medication Side Effects: none  Family Medical/ Social History: Changes? No  MENTAL HEALTH EXAM:  There were no vitals taken for this visit.There is no height or weight on file to calculate BMI.  General Appearance: Casual, Neat, Well Groomed and Obese  Eye Contact:  Good  Speech:  Clear and Coherent  Volume:  Normal  Mood:  Euthymic  Affect:  Appropriate  Thought Process:  Goal Directed and Descriptions of Associations: Intact  Orientation:  Full (Time, Place, and Person)  Thought Content: Logical   Suicidal Thoughts:  No  Homicidal Thoughts:  No  Memory:  WNL  Judgement:  Good  Insight:  Good  Psychomotor Activity:  Normal  Concentration:  Concentration: Good  Recall:  Good  Fund of Knowledge: Good  Language: Good  Assets:  Desire for Improvement  ADL's:  Intact  Cognition: WNL  Prognosis:  Good    DIAGNOSES:    ICD-10-CM  1. Bipolar I disorder (HCC)  F31.9     2. Generalized anxiety disorder  F41.1        Receiving Psychotherapy: No    RECOMMENDATIONS:  PDMP reviewed.  I provided 30 minutes of face to face time during this encounter, including time spent before and after the visit in records review, medical decision making, and charting.  We discussed different options for the anxiety.  Recommend increasing gabapentin from a total of 200 mg twice daily to 300 mg  twice daily.  He would like to try that. As far as the Xanax goes, recommend he try 1/2 pill in the morning before work so it will not make him drowsy hopefully.  Then he can take 1 in the evening when needed and it does not matter if he is drowsy or not. Contract for safety is in place.  He knows to call 911, the suicide hotline, or go to the nearest ER if the suicidal thoughts become active. Continue Xanax 0.5 mg, 1/2-1 twice daily as needed. Continue Tegretol 200 mg, 1 p.o. twice daily. Increase gabapentin to 300 mg, 1 p.o. twice daily. Return in 2-3 months.  Melony Overly, PA-C

## 2021-01-17 ENCOUNTER — Other Ambulatory Visit: Payer: Self-pay

## 2021-01-17 ENCOUNTER — Ambulatory Visit (INDEPENDENT_AMBULATORY_CARE_PROVIDER_SITE_OTHER): Payer: BC Managed Care – PPO | Admitting: Physician Assistant

## 2021-01-17 ENCOUNTER — Encounter: Payer: Self-pay | Admitting: Physician Assistant

## 2021-01-17 DIAGNOSIS — F319 Bipolar disorder, unspecified: Secondary | ICD-10-CM | POA: Diagnosis not present

## 2021-01-17 DIAGNOSIS — F411 Generalized anxiety disorder: Secondary | ICD-10-CM | POA: Diagnosis not present

## 2021-01-17 MED ORDER — CARBAMAZEPINE 200 MG PO TABS: 200.0000 mg | ORAL_TABLET | Freq: Two times a day (BID) | ORAL | 1 refills | Status: DC

## 2021-01-17 MED ORDER — GABAPENTIN 600 MG PO TABS: 600.0000 mg | ORAL_TABLET | Freq: Two times a day (BID) | ORAL | 1 refills | Status: DC

## 2021-01-17 NOTE — Progress Notes (Signed)
[facility]  Patient ID: Gregory Howe,  MRN: [id number]  PCP: Joya [name]  Date of Evaluation: 01/17/2021 Time spent:30 minutes  Chief Complaint:  Chief Complaint   Anxiety; Depression; Follow-up     HISTORY/CURRENT STATUS: For routine med check.  At LOV, we increased gabapentin.  Feels a slight improvement in the anxiety.  He still needs to take the Xanax for rescue however.  Not really having panic attacks but still an intense sense of dread like something bad is going to happen a lot of the time.  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  He sleeps okay, needs the Xanax often in the evening to help him relax so he can go to sleep.  Still does not really enjoy a lot of things, but that is no different than it has been for him.  Energy and motivation are the same.  He does not cry easily.  Appetite is normal and weight is stable.  No suicidal or homicidal thoughts.  Patient denies increased energy with decreased need for sleep, no increased talkativeness, no racing thoughts, no impulsivity or risky behaviors, no increased spending, no increased libido, no grandiosity, no increased irritability or anger unless he is triggered.  And no hallucinations.  Denies dizziness, syncope, seizures, numbness, tingling, tremor, tics, unsteady gait, slurred speech, confusion. Denies muscle or joint pain, stiffness, or dystonia.  Individual Medical History/ Review of Systems: Changes? :Yes    having more back pain. Had MRI yesterday but results pending.   Past medications for mental health diagnoses include: Latuda, Depakote, Lexapro, Lamictal, Ambien, Risperdal, Xanax, Tegretol, Seroquel caused severe sedation, Zoloft  Allergies: Lamictal [lamotrigine]  Current Medications:  Current Outpatient Medications:    AJOVY 225 MG/1.5ML SOAJ, SMARTSIG:1 Pre-Filled Pen Syringe SUB-Q Once a Month, Disp: , Rfl:    ALPRAZolam (XANAX) 0.5 MG tablet, TAKE 1 TABLET BY MOUTH   TWICE DAILY AS NEEDED. FOR  ANXIETY, Disp: 180 tablet, Rfl: 0   gabapentin (NEURONTIN) 600 MG tablet, Take 1 tablet (600 mg total) by mouth 2 (two) times daily., Disp: 180 tablet, Rfl: 1   lisinopril (PRINIVIL,ZESTRIL) 5 MG tablet, , Disp: , Rfl:    MELOXICAM PO, Take by mouth., Disp: , Rfl:    metoprolol tartrate (LOPRESSOR) 50 MG tablet, , Disp: , Rfl:    carbamazepine (TEGRETOL) 200 MG tablet, Take 1 tablet (200 mg total) by mouth 2 (two) times daily., Disp: 180 tablet, Rfl: 1   Galcanezumab-gnlm (EMGALITY [place]), Inject into the skin. (Patient not taking: Reported on 01/17/2021), Disp: , Rfl:    SUMAtriptan (IMITREX) 100 MG tablet, TAKE ONE TABLET BY MOUTH WITH HEADACHE MAY REPEAT IN 2 HOURS MAX OF 2 TABLETS IN 24 HOURS (Patient not taking: No sig reported), Disp: , Rfl:    traMADol (ULTRAM) 50 MG tablet, Take by mouth every 6 (six) hours as needed. (Patient not taking: Reported on 01/17/2021), Disp: , Rfl:  Medication Side Effects: none  Family Medical/ Social History: Changes? No  MENTAL HEALTH EXAM:  There were no vitals taken for this visit.There is no height or weight on file to calculate BMI.  General Appearance: Casual, Neat, Well Groomed and Obese  Eye Contact:  Good  Speech:  Clear and Coherent  Volume:  Normal  Mood:  Euthymic  Affect:  Appropriate  Thought Process:  Goal Directed and Descriptions of Associations: Circumstantial  Orientation:  Full (Time, Place, and Person)  Thought Content: Logical   Suicidal Thoughts:  No  Homicidal Thoughts:  No  Memory:  WNL  Judgement:  Good  Insight:  Good  Psychomotor Activity:  Normal  Concentration:  Concentration: Good and Attention Span: Good  Recall:  Good  Fund of Knowledge: Good  Language: Good  Assets:  Desire for Improvement  ADL's:  Intact  Cognition: WNL  Prognosis:  Good    DIAGNOSES:    ICD-10-CM   1. Bipolar I disorder (HCC)  F31.9     2. Generalized anxiety disorder  F41.1       Receiving Psychotherapy:  No    RECOMMENDATIONS:  PDMP reviewed.  Last gabapentin was filled 11/13/2020.  Last Xanax filled 11/07/2020. I provided 30 minutes of face to face time during this encounter, including time spent before and after the visit in records review, medical decision making, and charting.  We discussed different treatment options.  It seems that he has responded to increasing the gabapentin and has tolerated it well.  Will increase again to help with anxiety. Increase gabapentin to 600 mg, 1 p.o. twice daily. Continue Xanax 0.5 mg, 1/2-1 twice daily as needed. Continue Tegretol 200 mg, 1 p.o. twice daily. Return in 6 to 8 weeks.  Melony Overly, PA-C

## 2021-03-05 DIAGNOSIS — Z0289 Encounter for other administrative examinations: Secondary | ICD-10-CM

## 2021-03-08 ENCOUNTER — Other Ambulatory Visit: Payer: Self-pay

## 2021-03-08 ENCOUNTER — Ambulatory Visit (INDEPENDENT_AMBULATORY_CARE_PROVIDER_SITE_OTHER): Payer: BC Managed Care – PPO | Admitting: Physician Assistant

## 2021-03-08 ENCOUNTER — Encounter: Payer: Self-pay | Admitting: Physician Assistant

## 2021-03-08 DIAGNOSIS — F411 Generalized anxiety disorder: Secondary | ICD-10-CM

## 2021-03-08 DIAGNOSIS — F32A Depression, unspecified: Secondary | ICD-10-CM | POA: Diagnosis not present

## 2021-03-08 DIAGNOSIS — R5383 Other fatigue: Secondary | ICD-10-CM | POA: Diagnosis not present

## 2021-03-08 DIAGNOSIS — F319 Bipolar disorder, unspecified: Secondary | ICD-10-CM

## 2021-03-08 MED ORDER — MODAFINIL 200 MG PO TABS: 100.0000 mg | ORAL_TABLET | Freq: Every day | ORAL | 1 refills | Status: DC

## 2021-03-08 NOTE — Progress Notes (Signed)
[facility]  Patient ID: Gregory Howe,  MRN: [id number]  PCP: Joya [name]  Date of Evaluation: 03/08/2021  time spent:40 minutes  Chief Complaint:  Chief Complaint   Anxiety; Depression; Follow-up      HISTORY/CURRENT STATUS: For routine med check.  He couldn't tolerate the increase of Gabapentin. Caused dizziness, blurred vision. "My emotions were all over the place. I was thinking about a dog of ours that died in 09-22-12 and I cried for about 20 mins." Was triggered by going to a dinner theater, they sang a certain song that he used to sing to his dog.  Has decreased the Gabapentin and those SE have improved. But still gets dizzy some, even before the Gabapentin.   He feels like "this is as good as it ever going to get."  Work is a huge stressor and thinks if he can get away from [place] he would feel some better.  Has a hard time enjoying things, energy and motivation are very low.  Not missing work.  Does get emotional easily.  Appetite is normal and weight is stable.  Not isolating any more than he always has, he enjoys being with his wife but that is about it.  Denies suicidal or homicidal thoughts.  Does get angry and irritable esp about work. He holds it all in. In the past he would punch a door or something.  Hasn't done that in about 2 years. He denies increased energy with decreased need for sleep, no increased talkativeness, no racing thoughts, no impulsivity or risky behaviors, no increased spending, no increased libido, no grandiosity, and no hallucinations.  Denies syncope, seizures, numbness, tingling, tremor, tics, unsteady gait, slurred speech, confusion.  Has chronic low back pain associated with stiffness, no dystonia.  Individual Medical History/ Review of Systems: Changes? :Yes   had MRI of Lumbar spine recently, showed arthritis. Is in water therapy seems to be helping some.  Past medications for mental health diagnoses include: Latuda, Depakote,  Lexapro, Lamictal, Ambien, Risperdal, Xanax, Tegretol, Seroquel caused severe sedation, Zoloft, gabapentin at higher doses caused blurred vision and dizziness.  Allergies: Lamictal [lamotrigine]  Current Medications:  Current Outpatient Medications:    AJOVY 225 MG/1.5ML SOAJ, SMARTSIG:1 Pre-Filled Pen Syringe SUB-Q Once a Month, Disp: , Rfl:    ALPRAZolam (XANAX) 0.5 MG tablet, TAKE 1 TABLET BY MOUTH  TWICE DAILY AS NEEDED. FOR  ANXIETY, Disp: 180 tablet, Rfl: 0   carbamazepine (TEGRETOL) 200 MG tablet, Take 1 tablet (200 mg total) by mouth 2 (two) times daily., Disp: 180 tablet, Rfl: 1   gabapentin (NEURONTIN) 600 MG tablet, Take 1 tablet (600 mg total) by mouth 2 (two) times daily. (Patient taking differently: Take 300 mg by mouth 2 (two) times daily.), Disp: 180 tablet, Rfl: 1   lisinopril (PRINIVIL,ZESTRIL) 5 MG tablet, , Disp: , Rfl:    MELOXICAM PO, Take by mouth., Disp: , Rfl:    metoprolol tartrate (LOPRESSOR) 50 MG tablet, , Disp: , Rfl:    modafinil (PROVIGIL) 200 MG tablet, Take 0.5 tablets (100 mg total) by mouth daily., Disp: 30 tablet, Rfl: 1   Galcanezumab-gnlm (EMGALITY [place]), Inject into the skin. (Patient not taking: No sig reported), Disp: , Rfl:    SUMAtriptan (IMITREX) 100 MG tablet, TAKE ONE TABLET BY MOUTH WITH HEADACHE MAY REPEAT IN 2 HOURS MAX OF 2 TABLETS IN 24 HOURS (Patient not taking: No sig reported), Disp: , Rfl:    traMADol (ULTRAM) 50 MG tablet, Take by mouth every 6 (  six) hours as needed. (Patient not taking: No sig reported), Disp: , Rfl:  Medication Side Effects: dizziness/lightheadedness  Family Medical/ Social History: Changes? No  MENTAL HEALTH EXAM:  There were no vitals taken for this visit.There is no height or weight on file to calculate BMI.  General Appearance: Casual, Neat, Well Groomed and Obese  Eye Contact:  Good  Speech:  Clear and Coherent  Volume:  Normal  Mood:  Euthymic  Affect:  Appropriate  Thought Process:  Goal Directed and  Descriptions of Associations: Circumstantial  Orientation:  Full (Time, Place, and Person)  Thought Content: Logical   Suicidal Thoughts:  No  Homicidal Thoughts:  No  Memory:  WNL  Judgement:  Good  Insight:  Good  Psychomotor Activity:  Normal  Concentration:  Concentration: Good and Attention Span: Good  Recall:  Good  Fund of Knowledge: Good  Language: Good  Assets:  Desire for Improvement  ADL's:  Intact  Cognition: WNL  Prognosis:  Good    DIAGNOSES:    ICD-10-CM   1. Bipolar depression (HCC)  F31.9     2. Low energy  R53.83     3. Generalized anxiety disorder  F41.1     4. Melancholy  F32.A        Receiving Psychotherapy: No    RECOMMENDATIONS:  PDMP reviewed.  Last gabapentin filled 01/23/2021, last Xanax filled 11/07/2020.   I provided 40 minutes of face to face time during this encounter, including time spent before and after the visit in records review, medical decision making, and charting.  We discussed his lack of energy and motivation.  I agree that some of your stress from his job but adding a mild stimulant may be beneficial.  We discussed using Wellbutrin, modafinil, or armodafinil.  With his symptoms we agreed to try modafinil initially.  He understands he will have to pay out-of-pocket because this is not FDA approved for melancholy depression associated with decreased energy and motivation.  Prescription will be sent to the least expensive place noted on good [facility].  This was explained to him in detail. Benefits, risks, and side effects of modafinil were discussed and he accepts.  He understands that taking it too late in the day may cause insomnia. Start modafinil 200 mg, 1/2 pill p.o. every morning. Continue Gabapentin 300 mg, 1 p.o. twice daily. Continue Xanax 0.5 mg, 1/2-1 twice daily as needed. Continue Tegretol 200 mg, 1 p.o. twice daily. Return in 6 to 8 weeks.  Melony Overly, PA-C

## 2021-04-10 ENCOUNTER — Encounter (INDEPENDENT_AMBULATORY_CARE_PROVIDER_SITE_OTHER): Payer: Self-pay | Admitting: Family Medicine

## 2021-04-10 ENCOUNTER — Ambulatory Visit (INDEPENDENT_AMBULATORY_CARE_PROVIDER_SITE_OTHER): Payer: BC Managed Care – PPO | Admitting: Family Medicine

## 2021-04-10 ENCOUNTER — Other Ambulatory Visit: Payer: Self-pay

## 2021-04-10 VITALS — BP 113/75 | HR 81 | Temp 97.9°F | Ht 69.0 in | Wt 256.0 lb

## 2021-04-10 DIAGNOSIS — Z1331 Encounter for screening for depression: Secondary | ICD-10-CM | POA: Diagnosis not present

## 2021-04-10 DIAGNOSIS — E1169 Type 2 diabetes mellitus with other specified complication: Secondary | ICD-10-CM | POA: Diagnosis not present

## 2021-04-10 DIAGNOSIS — I152 Hypertension secondary to endocrine disorders: Secondary | ICD-10-CM

## 2021-04-10 DIAGNOSIS — G43709 Chronic migraine without aura, not intractable, without status migrainosus: Secondary | ICD-10-CM

## 2021-04-10 DIAGNOSIS — Z6837 Body mass index (BMI) 37.0-37.9, adult: Secondary | ICD-10-CM

## 2021-04-10 DIAGNOSIS — R0602 Shortness of breath: Secondary | ICD-10-CM

## 2021-04-10 DIAGNOSIS — E1159 Type 2 diabetes mellitus with other circulatory complications: Secondary | ICD-10-CM

## 2021-04-10 DIAGNOSIS — F3170 Bipolar disorder, currently in remission, most recent episode unspecified: Secondary | ICD-10-CM

## 2021-04-10 DIAGNOSIS — R5383 Other fatigue: Secondary | ICD-10-CM | POA: Diagnosis not present

## 2021-04-10 DIAGNOSIS — G4733 Obstructive sleep apnea (adult) (pediatric): Secondary | ICD-10-CM

## 2021-04-10 DIAGNOSIS — F509 Eating disorder, unspecified: Secondary | ICD-10-CM

## 2021-04-11 LAB — CBC WITH DIFFERENTIAL/PLATELET
Basophils Absolute: 0.1 10*3/uL (ref 0.0–0.2)
Basos: 1 %
EOS (ABSOLUTE): 0.3 10*3/uL (ref 0.0–0.4)
Eos: 5 %
Hematocrit: 45.7 % (ref 37.5–51.0)
Hemoglobin: 16.2 g/dL (ref 13.0–17.7)
Immature Grans (Abs): 0 10*3/uL (ref 0.0–0.1)
Immature Granulocytes: 0 %
Lymphocytes Absolute: 1.7 10*3/uL (ref 0.7–3.1)
Lymphs: 25 %
MCH: 33.1 pg — ABNORMAL HIGH (ref 26.6–33.0)
MCHC: 35.4 g/dL (ref 31.5–35.7)
MCV: 94 fL (ref 79–97)
Monocytes Absolute: 0.5 10*3/uL (ref 0.1–0.9)
Monocytes: 7 %
Neutrophils Absolute: 4.2 10*3/uL (ref 1.4–7.0)
Neutrophils: 62 %
Platelets: 225 10*3/uL (ref 150–450)
RBC: 4.89 x10E6/uL (ref 4.14–5.80)
RDW: 12.4 % (ref 11.6–15.4)
WBC: 6.9 10*3/uL (ref 3.4–10.8)

## 2021-04-11 LAB — LIPID PANEL WITH LDL/HDL RATIO
Cholesterol, Total: 155 mg/dL (ref 100–199)
HDL: 37 mg/dL — ABNORMAL LOW (ref >39)
LDL Chol Calc (NIH): 90 mg/dL (ref 0–99)
LDL/HDL Ratio: 2.4 ratio (ref 0.0–3.6)
Triglycerides: 163 mg/dL — ABNORMAL HIGH (ref 0–149)
VLDL Cholesterol Cal: 28 mg/dL (ref 5–40)

## 2021-04-11 LAB — HEMOGLOBIN A1C
Est. average glucose Bld gHb Est-mCnc: 126 mg/dL
Hgb A1c MFr Bld: 6 % — ABNORMAL HIGH (ref 4.8–5.6)

## 2021-04-11 LAB — COMPREHENSIVE METABOLIC PANEL
ALT: 33 IU/L (ref 0–44)
AST: 25 IU/L (ref 0–40)
Albumin/Globulin Ratio: 1.8 (ref 1.2–2.2)
Albumin: 4.6 g/dL (ref 3.8–4.9)
Alkaline Phosphatase: 79 IU/L (ref 44–121)
BUN/Creatinine Ratio: 11 (ref 9–20)
BUN: 12 mg/dL (ref 6–24)
Bilirubin Total: 0.4 mg/dL (ref 0.0–1.2)
CO2: 21 mmol/L (ref 20–29)
Calcium: 10.1 mg/dL (ref 8.7–10.2)
Chloride: 99 mmol/L (ref 96–106)
Creatinine, Ser: 1.07 mg/dL (ref 0.76–1.27)
Globulin, Total: 2.6 g/dL (ref 1.5–4.5)
Glucose: 106 mg/dL — ABNORMAL HIGH (ref 70–99)
Potassium: 4.5 mmol/L (ref 3.5–5.2)
Sodium: 140 mmol/L (ref 134–144)
Total Protein: 7.2 g/dL (ref 6.0–8.5)
eGFR: 83 mL/min/{1.73_m2} (ref >59)

## 2021-04-11 LAB — FOLATE: Folate: >20 ng/mL (ref >3.0)

## 2021-04-11 LAB — TSH: TSH: 2.02 u[IU]/mL (ref 0.450–4.500)

## 2021-04-11 LAB — T3: T3, Total: 120 ng/dL (ref 71–180)

## 2021-04-11 LAB — VITAMIN D 25 HYDROXY (VIT D DEFICIENCY, FRACTURES): Vit D, 25-Hydroxy: 28.7 ng/mL — ABNORMAL LOW (ref 30.0–100.0)

## 2021-04-11 LAB — MICROALBUMIN / CREATININE URINE RATIO
Creatinine, Urine: 62.6 mg/dL
Microalb/Creat Ratio: <5 mg/g creat (ref 0–29)
Microalbumin, Urine: <3 ug/mL

## 2021-04-11 LAB — VITAMIN B12: Vitamin B-12: 305 pg/mL (ref 232–1245)

## 2021-04-11 LAB — T4, FREE: Free T4: 0.84 ng/dL (ref 0.82–1.77)

## 2021-04-11 LAB — INSULIN, RANDOM: INSULIN: 26.4 u[IU]/mL — ABNORMAL HIGH (ref 2.6–24.9)

## 2021-04-11 NOTE — Progress Notes (Signed)
Dear Gregory Howe,   Thank you for referring Gregory Howe to our clinic. The following note includes my evaluation and treatment recommendations.  Chief Complaint:   OBESITY Gregory Howe (MR# [id number]) is a 55 y.o. male who presents for evaluation and treatment of obesity and related comorbidities. Current BMI is Body mass index is 37.8 kg/m. Gregory Howe has been struggling with his weight for many years and has been unsuccessful in either losing weight, maintaining weight loss, or reaching his healthy weight goal.  Gregory Howe is currently in the action stage of change and ready to dedicate time achieving and maintaining a healthier weight. Gregory Howe is interested in becoming our patient and working on intensive lifestyle modifications including (but not limited to) diet and exercise for weight loss.  Gregory Howe was referred by Gregory Howe. His wife, Gregory Howe is present. His PCP is Gregory Howe. Pt's weight gain started slowly since starting his current job at [employer]. He is eating out 3-4 times a week. Breakfast- (egg and sausage) burrito with peppers (may feel satisfied) with fig newton or energy bar with orange juice or apple juice; Lunch- hamburger helper or two hot dogs (2 cups) and water; 4-5 PM snack, mountain dew and bag of chips; Supper- can of [employer] Chicken Noodle soup or BLT.  Gregory Howe's habits were reviewed today and are as follows: His family eats meals together, his desired weight loss is 76 lbs, he started gaining weight in his 40's, his heaviest weight ever was 280 pounds, he has significant food cravings issues, he snacks frequently in the evenings, he skips meals frequently, he is frequently drinking liquids with calories, he frequently makes poor food choices, he has problems with excessive hunger, he frequently eats larger portions than normal, he has binge eating behaviors, and he struggles with emotional eating.  Depression Screen Gregory Howe's Food and Mood (modified  PHQ-9) score was 25.  Depression screen PHQ 2/9 04/10/2021  Decreased Interest 3  Down, Depressed, Hopeless 3  PHQ - 2 Score 6  Altered sleeping 1  Tired, decreased energy 3  Change in appetite 3  Feeling bad or failure about yourself  3  Trouble concentrating 3  Moving slowly or fidgety/restless 3  Suicidal thoughts 3  PHQ-9 Score 25  Difficult doing work/chores Extremely dIfficult   Subjective:   1. Other fatigue Gregory Howe admits to daytime somnolence and admits to waking up still tired. Patent has a history of symptoms of daytime fatigue, morning fatigue, Epworth sleepiness scale, and morning headache. Gregory Howe generally gets 7 hours of sleep per night, and states that he has poor sleep quality. Snoring is present. Apneic episodes are present. Epworth Sleepiness Score is 11. EKG normal sinus rhythm at 80 bpm.  2. SOB (shortness of breath) Gregory Howe notes increasing shortness of breath with exercising and seems to be worsening over time with weight gain. He notes getting out of breath sooner with activity than he used to. This has gotten worse recently. Gregory Howe denies shortness of breath at rest or orthopnea.  3. Type 2 diabetes mellitus with other specified complication, without long-term current use of insulin (HCC) Diagnosed a couple of years ago. Gregory Howe's last A1c was 5.6, and his last eye exam was a few months ago.  4. Hypertension associated with diabetes (HCC) Diagnosed decades ago. Gregory Howe is on lisinopril and metoprolol.  5. Bipolar affective disorder in remission Gregory Howe) Pt sees Gregory Howe, Gregory Howe. He is on carbamazepine and alprazolam.  6. Chronic migraine without aura without status migrainosus, not intractable  He sees a doctor in [place]. Pt is on Gregory Howe, a change from previous meds.  7. OSA (obstructive sleep apnea) [name] has a CPAP and wears it with almost 100% compliance. He was diagnosed in 2010.  8. Eating disorder, unspecified type Gregory Howe is struggling with  emotional eating and using food for comfort to the extent that it is negatively impacting his health. He has been working on behavior modification techniques to help reduce his emotional eating. He shows no sign of suicidal or homicidal ideations.   Assessment/Plan:   1. Other fatigue Gregory Howe does feel that his weight is causing his energy to be lower than it should be. Fatigue may be related to obesity, depression or many other causes. Labs will be ordered, and in the meanwhile, Gregory Howe will focus on self care including making healthy food choices, increasing physical activity and focusing on stress reduction. Check labs today.  - EKG 12-Lead - Vitamin B12 - Folate - T3 - T4, free - TSH - VITAMIN D 25 Hydroxy (Vit-D Deficiency, Fractures)  2. SOB (shortness of breath) Gregory Howe does feel that he gets out of breath more easily that he used to when he exercises. [name]'s shortness of breath appears to be obesity related and exercise induced. He has agreed to work on weight loss and gradually increase exercise to treat his exercise induced shortness of breath. Will continue to monitor closely. Check labs today.  - CBC with Differential/Platelet  3. Type 2 diabetes mellitus with other specified complication, without long-term current use of insulin (HCC) Good blood sugar control is important to decrease the likelihood of diabetic complications such as nephropathy, neuropathy, limb loss, blindness, coronary artery disease, and death. Intensive lifestyle modification including diet, exercise and weight loss are the first line of treatment for diabetes. Check labs today.  - Comprehensive metabolic panel - Hemoglobin A1c - Insulin, random - Lipid Panel With LDL/HDL Ratio - Urine Microalbumin w/creat. ratio  4. Hypertension associated with diabetes (HCC) Gregory Howe is working on healthy weight loss and exercise to improve blood pressure control. We will watch for signs of hypotension as he  continues his lifestyle modifications. CMP today.  5. Bipolar affective disorder in remission (HCC) Follow up with Gregory Overly, PA.  6. Chronic migraine without aura without status migrainosus, not intractable Follow up with neurology at next scheduled appt.  7. OSA (obstructive sleep apnea) Intensive lifestyle modifications are the first line treatment for this issue. We discussed several lifestyle modifications today and he will continue to work on diet, exercise and weight loss efforts. We will continue to monitor. Orders and follow up as documented in patient record. Follow up with pulmonology as needed. Continue CPAP use.  8. Eating disorder, unspecified type Behavior modification techniques were discussed today to help Gregory Howe deal with his emotional/non-hunger eating behaviors.  Orders and follow up as documented in patient record.   9. Depression screening Gregory Howe had a positive depression screening. Depression is commonly associated with obesity and often results in emotional eating behaviors. We will monitor this closely and work on CBT to help improve the non-hunger eating patterns. Referral to Psychology may be required if no improvement is seen as he continues in our clinic.  10. Obesity with current BMI of 37.8  Gregory Howe is currently in the action stage of change and his goal is to continue with weight loss efforts. I recommend Gregory Howe begin the structured treatment plan as follows:  He has agreed to the Category 4 Plan.  Exercise goals: No exercise  has been prescribed at this time.   Behavioral modification strategies: increasing lean protein intake, meal planning and cooking strategies, keeping healthy foods in the home, and planning for success.  He was informed of the importance of frequent follow-up visits to maximize his success with intensive lifestyle modifications for his multiple health conditions. He was informed we would discuss his lab results at his next visit  unless there is a critical issue that needs to be addressed sooner. Gregory Howe agreed to keep his next visit at the agreed upon time to discuss these results.  Objective:   Blood pressure 113/75, pulse 81, temperature 97.9 F (36.6 C), height 5\' 9"  (1.753 m), weight 256 lb (116.1 kg), SpO2 98 %. Body mass index is 37.8 kg/m.  EKG: Normal sinus rhythm, rate 80.  Indirect Calorimeter completed today shows a VO2 of 333 and a REE of 2304.  His calculated basal metabolic rate is thus his basal metabolic rate is worse than expected.  General: Cooperative, alert, well developed, in no acute distress. HEENT: Conjunctivae and lids unremarkable. Cardiovascular: Regular rhythm.  Lungs: Normal work of breathing. Neurologic: No focal deficits.   Lab Results  Component Value Date   CREATININE 1.07 04/10/2021   BUN 12 04/10/2021   NA 140 04/10/2021   K 4.5 04/10/2021   CL 99 04/10/2021   CO2 21 04/10/2021   Lab Results  Component Value Date   ALT 33 04/10/2021   AST 25 04/10/2021   ALKPHOS 79 04/10/2021   BILITOT 0.4 04/10/2021   Lab Results  Component Value Date   HGBA1C 6.0 (H) 04/10/2021   Lab Results  Component Value Date   INSULIN 26.4 (H) 04/10/2021   Lab Results  Component Value Date   TSH 2.020 04/10/2021   Lab Results  Component Value Date   CHOL 155 04/10/2021   HDL 37 (L) 04/10/2021   LDLCALC 90 04/10/2021   TRIG 163 (H) 04/10/2021   Lab Results  Component Value Date   WBC 6.9 04/10/2021   HGB 16.2 04/10/2021   HCT 45.7 04/10/2021   MCV 94 04/10/2021   PLT 225 04/10/2021    Attestation Statements:   Reviewed by clinician on day of visit: allergies, medications, problem list, medical history, surgical history, family history, social history, and previous encounter notes.  Time spent on visit including pre-visit chart review and post-visit charting and care was 60 minutes.   13/01/2021, CMA, am acting as transcriptionist for Edmund Hilda,  MD.  This is the patient's first visit at [facility]. The patient's NEW PATIENT PACKET was reviewed at length. Included in the packet: current and past health history, medications, allergies, ROS, gynecologic history (women only), surgical history, family history, social history, weight history, weight loss surgery history (for those that have had weight loss surgery), nutritional evaluation, mood and food questionnaire, PHQ9, Epworth questionnaire, sleep habits questionnaire, patient life and health improvement goals questionnaire. These will all be scanned into the patient's chart under media.   During the visit, I independently reviewed the patient's EKG, bioimpedance scale results, and indirect calorimeter results. I used this information to tailor a meal plan for the patient that will help him to lose weight and will improve his obesity-related conditions going forward. I performed a medically necessary appropriate examination and/or evaluation. I discussed the assessment and treatment plan with the patient. The patient was provided an opportunity to ask questions and all were answered. The patient agreed with the plan and demonstrated  an understanding of the instructions. Labs were ordered at this visit and will be reviewed at the next visit unless more critical results need to be addressed immediately. Clinical information was updated and documented in the EMR.   Time spent on visit including pre-visit chart review and post-visit care was 60 minutes.   A separate 15 minutes was spent on risk counseling (see above).   I have reviewed the above documentation for accuracy and completeness, and I agree with the above. - Reuben Likes, MD

## 2021-04-16 ENCOUNTER — Encounter: Payer: Self-pay | Admitting: Physician Assistant

## 2021-04-16 ENCOUNTER — Ambulatory Visit (INDEPENDENT_AMBULATORY_CARE_PROVIDER_SITE_OTHER): Payer: BC Managed Care – PPO | Admitting: Physician Assistant

## 2021-04-16 ENCOUNTER — Other Ambulatory Visit: Payer: Self-pay

## 2021-04-16 DIAGNOSIS — R7989 Other specified abnormal findings of blood chemistry: Secondary | ICD-10-CM

## 2021-04-16 DIAGNOSIS — F319 Bipolar disorder, unspecified: Secondary | ICD-10-CM

## 2021-04-16 DIAGNOSIS — F411 Generalized anxiety disorder: Secondary | ICD-10-CM | POA: Diagnosis not present

## 2021-04-16 DIAGNOSIS — F32A Depression, unspecified: Secondary | ICD-10-CM

## 2021-04-16 NOTE — Progress Notes (Signed)
[facility]  Patient ID: Gregory Howe,  MRN: [id number]  PCP: Joya [name]  Date of Evaluation: 04/16/2021  time spent:40 minutes  Chief Complaint:  Chief Complaint   Anxiety; Depression; Follow-up       HISTORY/CURRENT STATUS: For routine med check.  He couldn't tolerate the Modafinil b/c headaches and dizziness.  Even at a very low dose, he tried cutting the 200 mg pill into 1/4 pill and that even caused the problem so he stopped it.  It did help with energy and motivation for the first few days that he took it.  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  "If I could get out of [place] probably feel a lot better all the way around."  He does not have the irritability and anger like he used to have.  He is wondering though if he could get off of some of the medications.  He is wondering if they might be clashing some are just not working at all.  He has been on the carbamazepine for at least a decade and is not sure what it is doing.  He is able to enjoy things, energy and motivation are fair.  He has started going to [facility] office and is trying to get some help losing weight.  States he will feel better if he loses weight.  He does stress eat.  Does not cry easily.  Does not stay in bed all the time, not missing work.  He sleeps good most of the time but does have to use Xanax to help with sleep.  No suicidal or homicidal thoughts.  Anxiety is still present at least some of the time.  The Xanax does help but he usually takes it only in the evenings so he can wind down and relax.  Denies panic attacks.  Patient denies increased energy with decreased need for sleep, no increased talkativeness, no racing thoughts, no impulsivity or risky behaviors, no increased spending, no increased libido, no grandiosity, no increased irritability or anger, no paranoia, and no hallucinations.  Review of Systems  Constitutional:  Positive for malaise/fatigue.   HENT: Negative.    Eyes: Negative.   Respiratory: Negative.    Cardiovascular: Negative.   Gastrointestinal: Negative.   Genitourinary: Negative.   Musculoskeletal:  Positive for back pain.  Skin: Negative.   Neurological:  Positive for headaches.  Endo/Heme/Allergies: Negative.   Psychiatric/Behavioral:         See HPI.   Individual Medical History/ Review of Systems: Changes? :Yes   [facility].   Past medications for mental health diagnoses include: Latuda, Depakote, Lexapro, Lamictal, Ambien, Risperdal, Xanax, Tegretol, Seroquel caused severe sedation, Zoloft, gabapentin at higher doses caused blurred vision and dizziness. Modafinil caused dizziness and headaches.   Allergies: Lamictal [lamotrigine]  Current Medications:  Current Outpatient Medications:    AJOVY 225 MG/1.5ML SOAJ, SMARTSIG:1 Pre-Filled Pen Syringe SUB-Q Once a Month, Disp: , Rfl:    ALPRAZolam (XANAX) 0.5 MG tablet, TAKE 1 TABLET BY MOUTH  TWICE DAILY AS NEEDED. FOR  ANXIETY, Disp: 180 tablet, Rfl: 0   Cholestyramine POWD, by Does not apply route. 1/2 scoop per day, Disp: , Rfl:    gabapentin (NEURONTIN) 600 MG tablet, Take 1 tablet (600 mg total) by mouth 2 (two) times daily. (Patient taking differently: Take 300 mg by mouth 2 (two) times daily.), Disp: 180 tablet, Rfl: 1   lisinopril (PRINIVIL,ZESTRIL) 5 MG tablet, , Disp: , Rfl:  meloxicam (MOBIC) 15 MG tablet, Take by mouth., Disp: , Rfl:    metFORMIN (GLUCOPHAGE) 500 MG tablet, Take 500 mg by mouth 2 (two) times daily with a meal. Metformin ER- 2 in the am, 2 in the pm, Disp: , Rfl:    metoprolol tartrate (LOPRESSOR) 50 MG tablet, 1 tablet in the am, 1/2 tablet in pm, Disp: , Rfl:    Multiple Vitamin (MULTIVITAMIN) tablet, Take 1 tablet by mouth daily., Disp: , Rfl:    Omega-3 Fatty Acids (OMEGA 3 500 PO), Take 500 mg by mouth., Disp: , Rfl:    omeprazole (PRILOSEC) 40 MG capsule, Take 40 mg by mouth daily., Disp: , Rfl:     modafinil (PROVIGIL) 200 MG tablet, Take 0.5 tablets (100 mg total) by mouth daily. (Patient not taking: Reported on 04/16/2021), Disp: 30 tablet, Rfl: 1 Medication Side Effects: dizziness/lightheadedness and headache  from Modafinil   Family Medical/ Social History: Changes? No  MENTAL HEALTH EXAM:  There were no vitals taken for this visit.There is no height or weight on file to calculate BMI.  General Appearance: Casual, Neat, Well Groomed and Obese  Eye Contact:  Good  Speech:  Clear and Coherent  Volume:  Normal  Mood:  Euthymic  Affect:  Appropriate  Thought Process:  Goal Directed and Descriptions of Associations: Circumstantial  Orientation:  Full (Time, Place, and Person)  Thought Content: Logical   Suicidal Thoughts:  No  Homicidal Thoughts:  No  Memory:  WNL  Judgement:  Good  Insight:  Good  Psychomotor Activity:  Normal  Concentration:  Concentration: Good and Attention Span: Good  Recall:  Good  Fund of Knowledge: Good  Language: Good  Assets:  Desire for Improvement Financial Resources/Insurance Housing Social Support Talents/Skills Transportation Vocational/Educational  ADL's:  Intact  Cognition: WNL  Prognosis:  Good   Labs from 04/10/2021 were reviewed. Significant abnl from my stand point is Vitamin D, 28.7 CBC, B12, Folate, TSH, CMP, A1C, Lipids all reviewed.  DIAGNOSES:    ICD-10-CM   1. Bipolar depression (HCC)  F31.9     2. Generalized anxiety disorder  F41.1     3. Melancholy  F32.A     4. Low vitamin D level  R79.89         Receiving Psychotherapy: No    RECOMMENDATIONS:  PDMP reviewed.  Modafinil filled 03/08/2021.  Last gabapentin filled 01/23/2021, last Xanax filled 11/07/2020.   I provided 40 minutes of face to face time during this encounter, including time spent before and after the visit in records review, medical decision making, counseling pertinent to today's visit, and charting.  We discussed his labs. He'll be following up  with the provider who ordered them in a few weeks. I recommend adding Vit D 50,000 iu weekly, but won't order unless they don't. In psychiatry, we want the number around 60-70, helps w/ mood. He'll let me know what that provider proposes. Do recommend OTC Vitamin D now.  We also discussed the carbamazepine.  He would like to try getting off it, to see how he does. Wean CBZ 300 mg by taking 1 a day for a week, then stop. If he gets more angry/irritable, restart it at 1 po daily for 3 days, then bid. Will need labs 5-7 days after restarting it though.  Continue Gabapentin 300 mg, 1 p.o. twice daily. Continue Xanax 0.5 mg, 1/2-1 twice daily as needed. Return in 6 wks.  Melony Overly, PA-C

## 2021-04-16 NOTE — Patient Instructions (Signed)
On the Carbamazepine 300 mg, one daily for 1 week and then stop.

## 2021-04-29 ENCOUNTER — Encounter (INDEPENDENT_AMBULATORY_CARE_PROVIDER_SITE_OTHER): Payer: Self-pay | Admitting: Family Medicine

## 2021-04-29 ENCOUNTER — Other Ambulatory Visit: Payer: Self-pay

## 2021-04-29 ENCOUNTER — Ambulatory Visit (INDEPENDENT_AMBULATORY_CARE_PROVIDER_SITE_OTHER): Payer: BLUE CROSS/BLUE SHIELD | Admitting: Family Medicine

## 2021-04-29 ENCOUNTER — Ambulatory Visit (INDEPENDENT_AMBULATORY_CARE_PROVIDER_SITE_OTHER): Payer: BC Managed Care – PPO | Admitting: Family Medicine

## 2021-04-29 VITALS — BP 112/74 | HR 99 | Temp 98.1°F | Ht 69.0 in | Wt 256.0 lb

## 2021-04-29 DIAGNOSIS — E1169 Type 2 diabetes mellitus with other specified complication: Secondary | ICD-10-CM | POA: Diagnosis not present

## 2021-04-29 DIAGNOSIS — E559 Vitamin D deficiency, unspecified: Secondary | ICD-10-CM | POA: Diagnosis not present

## 2021-04-29 DIAGNOSIS — Z6837 Body mass index (BMI) 37.0-37.9, adult: Secondary | ICD-10-CM

## 2021-04-29 DIAGNOSIS — E1159 Type 2 diabetes mellitus with other circulatory complications: Secondary | ICD-10-CM

## 2021-04-29 DIAGNOSIS — I152 Hypertension secondary to endocrine disorders: Secondary | ICD-10-CM

## 2021-04-29 DIAGNOSIS — E1165 Type 2 diabetes mellitus with hyperglycemia: Secondary | ICD-10-CM | POA: Diagnosis not present

## 2021-04-29 DIAGNOSIS — E785 Hyperlipidemia, unspecified: Secondary | ICD-10-CM

## 2021-04-29 MED ORDER — VITAMIN D (ERGOCALCIFEROL) 1.25 MG (50000 UNIT) PO CAPS: 50000.0000 [IU] | ORAL_CAPSULE | ORAL | 0 refills | Status: DC

## 2021-04-29 NOTE — Progress Notes (Signed)
Chief Complaint:   OBESITY Gregory Howe is here to discuss his progress with his obesity treatment plan along with follow-up of his obesity related diagnoses. Gregory Howe is on the Category 4 Plan and states he is following his eating plan approximately 30-40% of the time. Gregory Howe states he is doing PT, aqua therapy, and walking 15-40 minutes 3-4 times per week.  Today's visit was #: 2 Starting weight: 256 lbs Starting date: 04/10/2021 Today's weight: 256 lbs Today's date: 04/29/2021 Total lbs lost to date: 0 Total lbs lost since last in-office visit: 0  Interim History: Gregory Howe had a family get together over Thanksgiving. He was not as adherent to plan over the last few weeks as he would have liked. He tried to make some substitutions calorie wise. Pt has significantly decreased quantity of food consumption over the last few weeks. He is trying to follow the skeleton of increased protein intake. He is going to the beach for his birthday.  Subjective:   1. Type 2 diabetes mellitus with hyperglycemia, without long-term current use of insulin (HCC) Discussed labs with patient today. Gregory Howe is on Metformin. His A1c is 6.0 and insulin level 26.4. He is trying to be more mindful of carb intake.  2. Hypertension associated with diabetes (HCC) BP well controlled. Pt denies chest pain/chest pressure/headache. Pt is on lisinopril and lopressor.  3. Vitamin D deficiency New. Discussed labs with patient today. Gregory Howe's Vit D level is 28.7. He has started taking additional Vit D.  4. Hyperlipidemia associated with type 2 diabetes mellitus (HCC) Discussed labs with patient today. Pt's LDL is 90, HDL 37, and triglycerides 742. He is not on statin therapy. 10 year ASCVD risk score 6.8%  The 10-year ASCVD risk score (Arnett DK, et al., 2019) is: 6.8%   Values used to calculate the score:     Age: 16 years     Sex: Male     Is Non-Hispanic African American: No     Diabetic: Yes     Tobacco  smoker: No     Systolic Blood Pressure: 112 mmHg     Is BP treated: No     HDL Cholesterol: 37 mg/dL     Total Cholesterol: 155 mg/dL  Assessment/Plan:   1. Type 2 diabetes mellitus with hyperglycemia, without long-term current use of insulin (HCC) Good blood sugar control is important to decrease the likelihood of diabetic complications such as nephropathy, neuropathy, limb loss, blindness, coronary artery disease, and death. Intensive lifestyle modification including diet, exercise and weight loss are the first line of treatment for diabetes. Follow up labs in 3 months. No new medications at this time.  2. Hypertension associated with diabetes (HCC) Gregory Howe is working on healthy weight loss and exercise to improve blood pressure control. We will watch for signs of hypotension as he continues his lifestyle modifications. BP at goal. Follow up on BP at next appt. May be able to start decreasing medications.  3. Vitamin D deficiency Low Vitamin D level contributes to fatigue and are associated with obesity, breast, and colon cancer. He agrees to start to take prescription Vitamin D 50,000 IU every week and will follow-up for routine testing of Vitamin D, at least 2-3 times per year to avoid over-replacement. He is to stop the OTC Vit D that he started taking.  Start- Vitamin D, Ergocalciferol, (DRISDOL) 1.25 MG (50000 UNIT) CAPS capsule; Take 1 capsule (50,000 Units total) by mouth every 7 (seven) days.  Dispense: 4 capsule; Refill: 0  4. Hyperlipidemia associated with type 2 diabetes mellitus (HCC) Cardiovascular risk and specific lipid/LDL goals reviewed.  We discussed several lifestyle modifications today and Gregory Howe will continue to work on diet, exercise and weight loss efforts. Orders and follow up as documented in patient record. Repeat labs in 3 months. No medication at this time.  Counseling Intensive lifestyle modifications are the first line treatment for this issue. Dietary  changes: Increase soluble fiber. Decrease simple carbohydrates. Exercise changes: Moderate to vigorous-intensity aerobic activity 150 minutes per week if tolerated. Lipid-lowering medications: see documented in medical record.  5. Obesity with current BMI of 37.9  Gregory Howe is currently in the action stage of change. As such, his goal is to continue with weight loss efforts. He has agreed to the Category 4 Plan.   Exercise goals:  As is  Behavioral modification strategies: increasing lean protein intake, meal planning and cooking strategies, and keeping healthy foods in the home.  Gregory Howe has agreed to follow-up with our clinic in 2-3 weeks. He was informed of the importance of frequent follow-up visits to maximize his success with intensive lifestyle modifications for his multiple health conditions.   Objective:   Blood pressure 112/74, pulse 99, temperature 98.1 F (36.7 C), height 5\' 9"  (1.753 m), weight 256 lb (116.1 kg), SpO2 97 %. Body mass index is 37.8 kg/m.  General: Cooperative, alert, well developed, in no acute distress. HEENT: Conjunctivae and lids unremarkable. Cardiovascular: Regular rhythm.  Lungs: Normal work of breathing. Neurologic: No focal deficits.   Lab Results  Component Value Date   CREATININE 1.07 04/10/2021   BUN 12 04/10/2021   NA 140 04/10/2021   K 4.5 04/10/2021   CL 99 04/10/2021   CO2 21 04/10/2021   Lab Results  Component Value Date   ALT 33 04/10/2021   AST 25 04/10/2021   ALKPHOS 79 04/10/2021   BILITOT 0.4 04/10/2021   Lab Results  Component Value Date   HGBA1C 6.0 (H) 04/10/2021   Lab Results  Component Value Date   INSULIN 26.4 (H) 04/10/2021   Lab Results  Component Value Date   TSH 2.020 04/10/2021   Lab Results  Component Value Date   CHOL 155 04/10/2021   HDL 37 (L) 04/10/2021   LDLCALC 90 04/10/2021   TRIG 163 (H) 04/10/2021   Lab Results  Component Value Date   VD25OH 28.7 (L) 04/10/2021   Lab Results   Component Value Date   WBC 6.9 04/10/2021   HGB 16.2 04/10/2021   HCT 45.7 04/10/2021   MCV 94 04/10/2021   PLT 225 04/10/2021    Attestation Statements:   Reviewed by clinician on day of visit: allergies, medications, problem list, medical history, surgical history, family history, social history, and previous encounter notes.  13/01/2021, CMA, am acting as transcriptionist for Edmund Hilda, MD.   I have reviewed the above documentation for accuracy and completeness, and I agree with the above. - Reuben Likes, MD

## 2021-05-09 ENCOUNTER — Ambulatory Visit (INDEPENDENT_AMBULATORY_CARE_PROVIDER_SITE_OTHER): Payer: BC Managed Care – PPO | Admitting: Family Medicine

## 2021-05-14 ENCOUNTER — Ambulatory Visit (INDEPENDENT_AMBULATORY_CARE_PROVIDER_SITE_OTHER): Payer: BC Managed Care – PPO | Admitting: Family Medicine

## 2021-06-05 ENCOUNTER — Telehealth (INDEPENDENT_AMBULATORY_CARE_PROVIDER_SITE_OTHER): Payer: BC Managed Care – PPO | Admitting: Family Medicine

## 2021-06-05 ENCOUNTER — Ambulatory Visit: Payer: BC Managed Care – PPO | Admitting: Physician Assistant

## 2021-06-05 ENCOUNTER — Encounter (INDEPENDENT_AMBULATORY_CARE_PROVIDER_SITE_OTHER): Payer: Self-pay

## 2021-06-05 ENCOUNTER — Encounter (INDEPENDENT_AMBULATORY_CARE_PROVIDER_SITE_OTHER): Payer: Self-pay | Admitting: Family Medicine

## 2021-06-05 ENCOUNTER — Other Ambulatory Visit: Payer: Self-pay

## 2021-06-05 NOTE — Telephone Encounter (Signed)
Please review

## 2021-06-30 ENCOUNTER — Other Ambulatory Visit: Payer: Self-pay | Admitting: Physician Assistant

## 2021-07-03 ENCOUNTER — Other Ambulatory Visit: Payer: Self-pay

## 2021-07-03 ENCOUNTER — Ambulatory Visit: Payer: BC Managed Care – PPO | Admitting: Physician Assistant

## 2021-07-03 ENCOUNTER — Encounter: Payer: Self-pay | Admitting: Physician Assistant

## 2021-07-03 DIAGNOSIS — F319 Bipolar disorder, unspecified: Secondary | ICD-10-CM

## 2021-07-03 DIAGNOSIS — R7989 Other specified abnormal findings of blood chemistry: Secondary | ICD-10-CM

## 2021-07-03 DIAGNOSIS — F4321 Adjustment disorder with depressed mood: Secondary | ICD-10-CM

## 2021-07-03 DIAGNOSIS — F32A Depression, unspecified: Secondary | ICD-10-CM | POA: Diagnosis not present

## 2021-07-03 DIAGNOSIS — Z634 Disappearance and death of family member: Secondary | ICD-10-CM

## 2021-07-03 DIAGNOSIS — F411 Generalized anxiety disorder: Secondary | ICD-10-CM

## 2021-07-03 MED ORDER — ALPRAZOLAM 0.5 MG PO TABS: 0.5000 mg | ORAL_TABLET | Freq: Two times a day (BID) | ORAL | 0 refills | Status: DC | PRN

## 2021-07-03 MED ORDER — GABAPENTIN 400 MG PO CAPS: 400.0000 mg | ORAL_CAPSULE | Freq: Two times a day (BID) | ORAL | 1 refills | Status: DC

## 2021-07-03 NOTE — Progress Notes (Signed)
[facility]  Patient ID: Gregory Howe,  MRN: [id number]  PCP: Elyn Aquas  Date of Evaluation: 07/03/2021 time spent:30 minutes  Chief Complaint:  Chief Complaint   Anxiety; Depression; Insomnia; Follow-up     HISTORY/CURRENT STATUS: For routine med check.  He has had a rough couple of months.  His stepdaughter died from an MI on 06-13-2022.  She was not in the greatest health but it was very unexpected and such shock.  His wife is having a really hard time.  He and his wife both had COVID between Christmas and New Year's.  Just a stressful time.  Work is also stressful as usual.  He has had to miss a few days because of COVID, the death of his 29, and then with dizziness and migraine.  He still has a hard time enjoying things but more than depression symptoms right now he is dealing with anxiety.  At the last visit about 6 weeks ago we weaned off the Tegretol and he felt better physically being off of it, at least initially.  But he has been more anxious, irritable, and stressed but that may not be from the lack of Tegretol.  States it may be from all the stressors going on right now.  The Xanax does help with the anxiety.  He usually does not take it during the day due to drowsiness but he does take 1 almost every night.  If he does not take it he has a hard time sleeping and feeling rested the next morning.  Energy and motivation have been low this past month especially.  He does not cry easily.  Personal hygiene is normal.  ADLs are within normal limits.  Not isolating.  Not missing work except as reported above.  No suicidal or homicidal thoughts.  Patient denies increased energy with decreased need for sleep, no increased talkativeness, no racing thoughts, no impulsivity or risky behaviors, no increased spending, no grandiosity, no paranoia, and no hallucinations.  Review of Systems  Constitutional:  Positive for malaise/fatigue.  HENT: Negative.    Eyes:  Negative.   Respiratory: Negative.    Cardiovascular: Negative.   Gastrointestinal: Negative.   Genitourinary: Negative.   Musculoskeletal: Negative.   Skin: Negative.   Neurological:  Positive for dizziness and headaches.       Both are chronic problems.  Has seen neuro in the past but not recently.  Endo/Heme/Allergies: Negative.   Psychiatric/Behavioral:         See HPI.    Individual Medical History/ Review of Systems: Changes? :Yes   see HPI.   Past medications for mental health diagnoses include: Latuda, Depakote, Lexapro, Lamictal, Ambien, Risperdal, Xanax, Tegretol, Seroquel caused severe sedation, Zoloft, gabapentin at higher doses caused blurred vision and dizziness. Modafinil caused dizziness and headaches.   Allergies: Lamictal [lamotrigine] and Modafinil  Current Medications:  Current Outpatient Medications:    gabapentin (NEURONTIN) 400 MG capsule, Take 1 capsule (400 mg total) by mouth 2 (two) times daily., Disp: 60 capsule, Rfl: 1   lisinopril (PRINIVIL,ZESTRIL) 5 MG tablet, , Disp: , Rfl:    meloxicam (MOBIC) 15 MG tablet, Take by mouth., Disp: , Rfl:    metFORMIN (GLUCOPHAGE) 500 MG tablet, Take 500 mg by mouth 2 (two) times daily with a meal. Metformin ER- 2 in the am, 2 in the pm, Disp: , Rfl:    metoprolol tartrate (LOPRESSOR) 50 MG tablet, 1 tablet in the am, 1/2 tablet in pm, Disp: , Rfl:  Multiple Vitamin (MULTIVITAMIN) tablet, Take 1 tablet by mouth daily., Disp: , Rfl:    Omega-3 Fatty Acids (OMEGA 3 500 PO), Take 500 mg by mouth., Disp: , Rfl:    omeprazole (PRILOSEC) 40 MG capsule, Take 40 mg by mouth daily., Disp: , Rfl:    Vitamin D, Ergocalciferol, (DRISDOL) 1.25 MG (50000 UNIT) CAPS capsule, Take 1 capsule (50,000 Units total) by mouth every 7 (seven) days., Disp: 4 capsule, Rfl: 0   AJOVY 225 MG/1.5ML SOAJ, SMARTSIG:1 Pre-Filled Pen Syringe SUB-Q Once a Month (Patient not taking: Reported on 07/03/2021), Disp: , Rfl:    ALPRAZolam (XANAX) 0.5 MG  tablet, Take 1 tablet (0.5 mg total) by mouth 2 (two) times daily as needed. for anxiety, Disp: 180 tablet, Rfl: 0   Cholestyramine POWD, by Does not apply route. 1/2 scoop per day (Patient not taking: Reported on 07/03/2021), Disp: , Rfl:  Medication Side Effects: none      Family Medical/ Social History: Changes? Step daughter died , 65 yo from MI.   [place]:  There were no vitals taken for this visit.There is no height or weight on file to calculate BMI.  General Appearance: Casual, Neat, Well Groomed and Obese  Eye Contact:  Good  Speech:  Clear and Coherent  Volume:  Normal  Mood:   sad  Affect:   sad  Thought Process:  Goal Directed and Descriptions of Associations: Circumstantial  Orientation:  Full (Time, Place, and Person)  Thought Content: Logical   Suicidal Thoughts:  No  Homicidal Thoughts:  No  Memory:  WNL  Judgement:  Good  Insight:  Good  Psychomotor Activity:  Normal  Concentration:  Concentration: Good and Attention Span: Good  Recall:  Good  Fund of Knowledge: Good  Language: Good  Assets:  Desire for Improvement Financial Resources/Insurance Housing Social Support Talents/Skills Transportation Vocational/Educational  ADL's:  Intact  Cognition: WNL  Prognosis:  Good    DIAGNOSES:    ICD-10-CM   1. Generalized anxiety disorder  F41.1     2. Bipolar depression ([place])  F31.9     3. Melancholy  F32.A     4. Low vitamin D level  R79.89     5. Grief at loss of child  F43.21    Z63.4       Receiving Psychotherapy: No    RECOMMENDATIONS:  PDMP reviewed.  Modafinil filled 03/08/2021.  Last gabapentin filled 01/23/2021, last Xanax filled 11/07/2020.   I provided 30 minutes of face to face time during this encounter, including time spent before and after the visit in records review, medical decision making, counseling pertinent to today's visit, and charting.  My condolences and the loss of his stepdaughter. Since anxiety is the worst problem  right now I recommend increasing the gabapentin.  At higher doses (unsure the dose however) he had more dizziness and reported headaches.  He does not really remember that but I had it noted in the chart.  He is willing to increase it though.  I will only go up by 200 mg/day and follow up within a month.  He knows to call though if he has any side effects such as dizziness or headache that will not subside within a week's time and I will decrease the gabapentin and probably go ahead and add Trileptal.  We did discuss this and the fact that it helps mood especially with irritability and anger and sometimes anxiety.  I would just prefer not to add another medicine  if we do not have to.  He understands and agrees.  We did talk about the benefits, risks, and side effects of Trileptal and he is willing to try it if we need to. Recommend he get back in to see the neurologist. Recommend counseling, grief share or individual therapist. His PCP is treating him for the low vitamin D. Increase gabapentin to 400 mg 1 p.o. twice daily. Continue Xanax 0.5 mg, 1/2-1 twice daily as needed. Return in 4 wks.  Donnal Moat, PA-C

## 2021-07-31 ENCOUNTER — Ambulatory Visit (INDEPENDENT_AMBULATORY_CARE_PROVIDER_SITE_OTHER): Payer: BC Managed Care – PPO | Admitting: Physician Assistant

## 2021-07-31 ENCOUNTER — Encounter: Payer: Self-pay | Admitting: Physician Assistant

## 2021-07-31 ENCOUNTER — Other Ambulatory Visit: Payer: Self-pay

## 2021-07-31 VITALS — BP 129/83 | HR 69

## 2021-07-31 DIAGNOSIS — F319 Bipolar disorder, unspecified: Secondary | ICD-10-CM

## 2021-07-31 DIAGNOSIS — F411 Generalized anxiety disorder: Secondary | ICD-10-CM | POA: Diagnosis not present

## 2021-07-31 DIAGNOSIS — Z566 Other physical and mental strain related to work: Secondary | ICD-10-CM

## 2021-07-31 DIAGNOSIS — G43109 Migraine with aura, not intractable, without status migrainosus: Secondary | ICD-10-CM | POA: Diagnosis not present

## 2021-07-31 DIAGNOSIS — R7989 Other specified abnormal findings of blood chemistry: Secondary | ICD-10-CM

## 2021-07-31 MED ORDER — GABAPENTIN 400 MG PO CAPS: 400.0000 mg | ORAL_CAPSULE | Freq: Two times a day (BID) | ORAL | 3 refills | Status: DC

## 2021-07-31 NOTE — Progress Notes (Signed)
[facility] ? ?Patient ID: Gregory Howe,  ?MRN: [id number] ? ?PCP: Joya [name] ? ?Date of Evaluation: 07/31/2021 ?time spent:40 minutes ? ?Chief Complaint:  ?Chief Complaint   ?Anxiety; Depression; Follow-up ?  ? ? ? ?HISTORY/CURRENT STATUS: ?For routine med check. ? ?Doing better as far as anxiety goes. We increased the Gabapentin and it's helped. Xanax also helps as usual. Not having PA, just a generalized sense of unease, like something bad can happy at any moment. Always worse the nights before he works the next day. ? ?Still very stressed at work. [employer] in [place] is 'such a toxic environment.' He's thought about applying for other jobs, but not sure if he'd 'be jumping from the frying pan into the fire.' He has about 10 more years he has to work so trying to stick it out.  ? ?Is able to enjoy things when not so stressed. Went to [employer] a few weeks ago and had fun.  Denies decreased energy or motivation.  Appetite has not changed.  No extreme sadness, tearfulness, or feelings of hopelessness, but of course he's still grieving for his step daughter who died in 07/05/2022.  Denies any changes in concentration, making decisions or remembering things. Sleeps good.  Denies suicidal or homicidal thoughts. ? ?Patient denies increased energy with decreased need for sleep, no increased talkativeness, no racing thoughts, no impulsivity or risky behaviors, no increased spending, no increased libido, no grandiosity, no increased irritability or anger, no paranoia, and no hallucinations. ? ?States H/A are worsening, now almost daily assoc w/ aura. H/O migraines, Emgality helped prevent them but insurance wouldn't pay so he switched to Aimovig and then Ajovy and neither helped. This was a few years ago. Hasn't been on anything for prophylaxis since then.  Left sided pain, aura in left eye only, described as a squiggly almost iridescent type image.  When he covers up his right eye he does not see it,  it is only in the left.  It moves when he moves his eye.  Aura can last minutes or hours.  Has had aura in the past, but not very often. Lying down is the only thing that helps. He has nausea sometimes, no vomiting. No dizziness, slurred speech, numbness in face or extremities, no facial drooping or drooling.  Denies weakness in extremities or problems w/ balance.  ? ?Review of Systems  ?Constitutional: Negative.   ?HENT: Negative.    ?Eyes:   ?     See HPI  ?Respiratory: Negative.    ?Cardiovascular: Negative.   ?Gastrointestinal: Negative.   ?Genitourinary: Negative.   ?Musculoskeletal: Negative.   ?Skin: Negative.   ?Neurological:   ?     See HPI  ?Endo/Heme/Allergies: Negative.   ?Psychiatric/Behavioral:    ?     See HPI  ? ? ?Individual Medical History/ Review of Systems: Changes? :Yes   see HPI. ? ?Past medications for mental health diagnoses include: ?Latuda, Depakote, Lexapro, Lamictal, Ambien, Risperdal, Xanax, Tegretol, Seroquel caused severe sedation, Zoloft, gabapentin at higher doses caused blurred vision and dizziness. Modafinil caused dizziness and headaches.  ? ?Allergies: Lamictal [lamotrigine] and Modafinil ? ?Current Medications:  ?Current Outpatient Medications:  ?  ALPRAZolam (XANAX) 0.5 MG tablet, Take 1 tablet (0.5 mg total) by mouth 2 (two) times daily as needed. for anxiety, Disp: 180 tablet, Rfl: 0 ?  lisinopril (PRINIVIL,ZESTRIL) 5 MG tablet, , Disp: , Rfl:  ?  meloxicam (MOBIC) 15 MG tablet, Take by mouth., Disp: , Rfl:  ?  metFORMIN (GLUCOPHAGE) 500 MG tablet, Take 500 mg by mouth 2 (two) times daily with a meal. Metformin ER- 2 in the am, 2 in the pm, Disp: , Rfl:  ?  metoprolol tartrate (LOPRESSOR) 50 MG tablet, 1 tablet in the am, 1/2 tablet in pm, Disp: , Rfl:  ?  Multiple Vitamin (MULTIVITAMIN) tablet, Take 1 tablet by mouth daily., Disp: , Rfl:  ?  Omega-3 Fatty Acids (OMEGA 3 500 PO), Take 500 mg by mouth., Disp: , Rfl:  ?  omeprazole (PRILOSEC) 40 MG capsule, Take 40 mg by mouth  daily., Disp: , Rfl:  ?  Vitamin D, Ergocalciferol, (DRISDOL) 1.25 MG (50000 UNIT) CAPS capsule, Take 1 capsule (50,000 Units total) by mouth every 7 (seven) days., Disp: 4 capsule, Rfl: 0 ?  Cholestyramine POWD, by Does not apply route. 1/2 scoop per day (Patient not taking: Reported on 07/03/2021), Disp: , Rfl:  ?  gabapentin (NEURONTIN) 400 MG capsule, Take 1 capsule (400 mg total) by mouth 2 (two) times daily., Disp: 180 capsule, Rfl: 3 ?Medication Side Effects: none     ? ?Family Medical/ Social History: Changes? No ? ? ?MENTAL HEALTH EXAM: ? ?Blood pressure 129/83, pulse 69.There is no height or weight on file to calculate BMI.  ?General Appearance: Casual, Neat, Well Groomed and Obese  ?Eye Contact:  Good  ?Speech:  Clear and Coherent  ?Volume:  Normal  ?Mood:  Euthymic  ?Affect:  Congruent  ?Thought Process:  Goal Directed and Descriptions of Associations: Circumstantial  ?Orientation:  Full (Time, Place, and Person)  ?Thought Content: Logical   ?Suicidal Thoughts:  No  ?Homicidal Thoughts:  No  ?Memory:  WNL  ?Judgement:  Good  ?Insight:  Good  ?Psychomotor Activity:   Normal with no gross neurologic deficits  ?Concentration:  Concentration: Good and Attention Span: Good  ?Recall:  Good  ?Fund of Knowledge: Good  ?Language: Good  ?Assets:  Desire for Improvement ?Financial Resources/Insurance ?Housing ?Social Support ?Talents/Skills ?Transportation ?Vocational/Educational  ?ADL's:  Intact  ?Cognition: WNL  ?Prognosis:  Good  ? ? ?DIAGNOSES:  ?  ICD-10-CM   ?1. Generalized anxiety disorder  F41.1   ?  ?2. Migraine with aura and without status migrainosus, not intractable  G43.109   ?  ?3. Work stress  Z56.6   ?  ?4. Bipolar I disorder (HCC)  F31.9   ?  ?5. Low vitamin D level  R79.89   ?  ? ? ? ?Receiving Psychotherapy: No  ? ? ?RECOMMENDATIONS:  ?PDMP reviewed.  Modafinil filled 03/08/2021.  Last gabapentin filled 07/03/2021.  Last Xanax filled 07/03/2021. ?I provided 30 minutes of face to face time during this  encounter, including time spent before and after the visit in records review, medical decision making, counseling pertinent to today's visit, and charting.  ?We again discussed the migraines.  These headaches are different than he has had in the past and should not be ignored.  He has seen a neurologist in [place] but prefers to see someone else.  We will refer him to Dr. Lucia Gaskins, Shawnie Dapper, NP or Everlene Other, NP.  Emgality was helpful in preventing the migraines in the past but insurance would not pay for it.  Ajovy and Aimovig were not beneficial.  Hopefully seeing a new provider, and his insurance may have changed who knows, will be helpful to provide what works for him.  He knows to go to the emergency room if he should have slurred speech hemiplegia, difficulty swallowing, or any other  signs of stroke. ?As far as his mental health is concerned, he is stable so no changes will be made. ? ?His PCP is treating him for the low vitamin D. ?Continue gabapentin 400 mg 1 p.o. twice daily. ?Continue Xanax 0.5 mg, 1/2-1 twice daily as needed. ?Return in 3 months.  ? ?Melony Overly, PA-C  ?

## 2021-08-05 ENCOUNTER — Encounter: Payer: Self-pay | Admitting: Physician Assistant

## 2021-09-02 NOTE — Progress Notes (Signed)
? ?Referring:  ?Cherie Ouch, PA-C ?[address] ?[address] ?[place],  [place] [address] ? ?PCP: ?Joya [name] ? ?Neurology was asked to evaluate Gregory Howe, a 56 year old male for a chief complaint of headaches.  Our recommendations of care will be communicated by shared medical record.   ? ?CC:  headaches ? ?History provided from self, wife Stanton Kidney ? ?HPI:  ?Medical co-morbidities: Anxiety, Bipolar disorder, DM2, HTN ? ?The patient presents for evaluation of headaches which began when he was a teenager. Headaches were intermittent as a teenager but have become more frequent in the past 2 years. He is currently getting spots in his vision 2-3 times per day which last one hour at a time. He will also see rippling water lines in the periphery of his left vision. He has constant frontal pressure with throbbing pain most intense over the left temple. Headaches are associated with phonophobia and are worse with activity.  ? ?His migraines were well controlled on Emgality. He had no headaches for 2 years, but then he was unable to get insurance to reapprove it. He has also tried Aimovig, which made him nauseated and Ajovy which was ineffective. Currently he is taking 5-6 ibuprofen 4 days per week for migraine management. ? ?Headache History: ?Onset: teenager ?Triggers: looking at screens, stress ?Aura: spots ?Location: frontal, left temporal ?Quality/Description: pressure, throbbing ?Severity: ?Associated Symptoms: ? Photophobia: no ? Phonophobia: yes ? Nausea: no ?Worse with activity?: yes ?Duration of headaches: constant ? ?Headache days per month: 30 ?Headache free days per month: 0 ? ?Current Treatment: ?Abortive ?ibuprofen ? ?Preventative ?none ? ?Prior Therapies                                 ?Lamictal ?Gabapentin 400 mg BID ?Tegretol 100 mg BID ?Lisinopril 5 mg daily ?Cymbalta  ?Topamax ?Metoprolol 50/25 ?Ajovy - lack of efficacy ?Emgality ?Aimovig - nausea ?Botox ? ?LABS: ?CBC ?   ?Component Value  Date/Time  ? WBC 6.9 04/10/2021 0956  ? RBC 4.89 04/10/2021 0956  ? HGB 16.2 04/10/2021 0956  ? HCT 45.7 04/10/2021 0956  ? PLT 225 04/10/2021 0956  ? MCV 94 04/10/2021 0956  ? MCH 33.1 (H) 04/10/2021 8242  ? MCHC 35.4 04/10/2021 0956  ? RDW 12.4 04/10/2021 0956  ? LYMPHSABS 1.7 04/10/2021 0956  ? EOSABS 0.3 04/10/2021 0956  ? BASOSABS 0.1 04/10/2021 0956  ? ? ?  Latest Ref Rng & Units 04/10/2021  ?  9:56 AM 05/30/2020  ?  8:14 AM  ?CMP  ?Glucose 70 - 99 mg/dL 353   614    ?BUN 6 - 24 mg/dL 12   16    ?Creatinine 0.76 - 1.27 mg/dL 4.31   5.40    ?Sodium 134 - 144 mmol/L 140   139    ?Potassium 3.5 - 5.2 mmol/L 4.5   4.6    ?Chloride 96 - 106 mmol/L 99   99    ?CO2 20 - 29 mmol/L 21   26    ?Calcium 8.7 - 10.2 mg/dL 08.6   9.8    ?Total Protein 6.0 - 8.5 g/dL 7.2   6.6    ?Total Bilirubin 0.0 - 1.2 mg/dL 0.4   <7.6    ?Alkaline Phos 44 - 121 IU/L 79   64    ?AST 0 - 40 IU/L 25   23    ?ALT 0 - 44 IU/L 33  37    ? ? ? ?IMAGING:  ?None, thinks he had a CTH at some point several years ago ? ? ?Current Outpatient Medications on File Prior to Visit  ?Medication Sig Dispense Refill  ? ALPRAZolam (XANAX) 0.5 MG tablet Take 1 tablet (0.5 mg total) by mouth 2 (two) times daily as needed. for anxiety 180 tablet 0  ? Cholecalciferol (VITAMIN D3) 125 MCG (5000 UT) CAPS Take 1 tablet by mouth daily.    ? gabapentin (NEURONTIN) 400 MG capsule Take 1 capsule (400 mg total) by mouth 2 (two) times daily. 180 capsule 3  ? lisinopril (PRINIVIL,ZESTRIL) 5 MG tablet     ? meloxicam (MOBIC) 15 MG tablet Take by mouth.    ? metFORMIN (GLUCOPHAGE) 500 MG tablet Take 500 mg by mouth 2 (two) times daily with a meal. Metformin ER- 2 in the am, 2 in the pm    ? metoprolol tartrate (LOPRESSOR) 50 MG tablet 1 tablet in the am, 1/2 tablet in pm    ? Multiple Vitamin (MULTIVITAMIN) tablet Take 1 tablet by mouth daily.    ? Omega-3 Fatty Acids (OMEGA 3 500 PO) Take 500 mg by mouth.    ? omeprazole (PRILOSEC) 40 MG capsule Take 40 mg by mouth daily.     ? Cholestyramine POWD by Does not apply route. 1/2 scoop per day (Patient not taking: Reported on 07/03/2021)    ? ?No current facility-administered medications on file prior to visit.  ? ? ? ?Allergies: ?Allergies  ?Allergen Reactions  ? Lamictal [Lamotrigine] Other (See Comments)  ?  headaches  ? Modafinil Other (See Comments)  ?  headache  ? ? ?Family History: ?Migraine or other headaches in the family:  no ?Aneurysms in a first degree relative:  no ?Brain tumors in the family:  no ?Other neurological illness in the family:   grandmother had stroke ? ?Past Medical History: ?Past Medical History:  ?Diagnosis Date  ? Ankle pain   ? Anxiety   ? Back pain   ? Bipolar 1 disorder (HCC)   ? Depression   ? Diabetes (HCC)   ? Gallbladder problem   ? Hypertension   ? Kidney disease   ? Knee pain   ? Migraine   ? Overweight   ? Sleep apnea   ? ? ?Past Surgical History ?Past Surgical History:  ?Procedure Laterality Date  ? CARPAL TUNNEL RELEASE Bilateral   ? CHOLECYSTECTOMY    ? ? ?Social History: ?Social History  ? ?Tobacco Use  ? Smoking status: Never  ? Smokeless tobacco: Never  ?Substance Use Topics  ? Alcohol use: Not Currently  ? Drug use: Not Currently  ? ? ?ROS: ?Negative for fevers, chills. Positive for headaches. All other systems reviewed and negative unless stated otherwise in HPI. ? ? ?Physical Exam:  ? ?Vital Signs: ?BP 123/81   Pulse 89   Ht 5\' 10"  (1.778 m)   Wt 259 lb 12.8 oz (117.8 kg)   BMI 37.28 kg/m?  ?GENERAL: well appearing,in no acute distress,alert ?SKIN:  Color, texture, turgor normal. No rashes or lesions ?HEAD:  Normocephalic/atraumatic. ?CV:  RRR ?RESP: Normal respiratory effort ?MSK: +tenderness to palpation over left temple, occiput, neck, and shoulders ? ?NEUROLOGICAL: ?Mental Status: Alert, oriented to person, place and time,Follows commands ?Cranial Nerves: PERRL, visual fields intact to confrontation, extraocular movements intact, facial sensation intact, no facial droop or ptosis,  hearing grossly intact, no dysarthria ?Motor: muscle strength 5/5 both upper and lower extremities,no drift, normal tone ?  Reflexes: 2+ throughout ?Sensation: intact to light touch all 4 extremities ?Coordination: Finger-to- nose-finger intact bilaterally ?Gait: normal-based ? ? ?IMPRESSION: ?56 year old male with a history of Anxiety, Bipolar disorder, DM2, HTN who presents for evaluation of migraines and vision changes which have worsened over the past 2 years. Will order MRI brain for worsening headaches in patient >50. He was previously well-controlled on Emgality with 0 headache days per month while on the medication. Will attempt to restart Emgality as he has failed multiple preventive medications including Ajovy, Aimovig, and Botox. Will start Maxalt for rescue. Counseled patient on limiting rescue medications to 2 days per week to avoid rebound headaches.  ? ?PLAN: ?-MRI brain with contrast ?-Preventive: Start Emgality 120 mg monthly ?-Rescue: Start Maxalt 10 mg PRN ?-Counseled on limiting rescue medications to 2 days per week to avoid rebound headaches ? ? ?I spent a total of 34 minutes chart reviewing and counseling the patient. Headache education was done. Discussed treatment options including preventive and acute medications. Discussed medication overuse headache and to limit use of acute treatments to no more than 2 days/week or 10 days/month. Discussed medication side effects, adverse reactions and drug interactions. Written educational materials and patient instructions outlining all of the above were given. ? ?Follow-up: 4 months ? ? ?Ocie DoyneJennifer Obe Ahlers, MD ?09/03/2021   ?10:50 AM ? ? ?

## 2021-09-03 ENCOUNTER — Encounter: Payer: Self-pay | Admitting: Psychiatry

## 2021-09-03 ENCOUNTER — Ambulatory Visit (INDEPENDENT_AMBULATORY_CARE_PROVIDER_SITE_OTHER): Payer: BC Managed Care – PPO | Admitting: Psychiatry

## 2021-09-03 VITALS — BP 123/81 | HR 89 | Ht 70.0 in | Wt 259.8 lb

## 2021-09-03 DIAGNOSIS — R519 Headache, unspecified: Secondary | ICD-10-CM

## 2021-09-03 DIAGNOSIS — G43119 Migraine with aura, intractable, without status migrainosus: Secondary | ICD-10-CM

## 2021-09-03 MED ORDER — RIZATRIPTAN BENZOATE 10 MG PO TABS: 10.0000 mg | ORAL_TABLET | ORAL | 3 refills | Status: DC | PRN

## 2021-09-03 MED ORDER — EMGALITY 120 MG/ML ~~LOC~~ SOAJ: 120.0000 mg | SUBCUTANEOUS | 3 refills | Status: DC

## 2021-09-03 MED ORDER — EMGALITY 120 MG/ML ~~LOC~~ SOAJ: 2.0000 "pen " | Freq: Once | SUBCUTANEOUS | 0 refills | Status: AC

## 2021-09-03 NOTE — Patient Instructions (Addendum)
Start Emgality for migraine prevention ?Start Maxalt as needed for migraines. Take at the onset of migraine. If headache recurs or does not fully resolve, you may take a second dose after 2 hours. Please avoid taking more than 2 days per week  ?MRI brain ?Try to limit over the counter medication use to 2 days per week to avoid rebound headaches ?

## 2021-09-09 ENCOUNTER — Telehealth: Payer: Self-pay | Admitting: Psychiatry

## 2021-09-09 NOTE — Telephone Encounter (Signed)
[employer] Berkley Harvey: [phone] (exp. 09/09/21 to 10/08/21) order faxed to [facility], they will reach out to the patient to schedule ?

## 2021-09-30 ENCOUNTER — Telehealth: Payer: Self-pay | Admitting: *Deleted

## 2021-09-30 NOTE — Telephone Encounter (Signed)
Received MRI brain report from [facility]. Placed on MD desk for review when she returns to office tomorrow.  ?

## 2021-10-01 ENCOUNTER — Encounter: Payer: Self-pay | Admitting: Psychiatry

## 2021-11-01 ENCOUNTER — Encounter: Payer: Self-pay | Admitting: Physician Assistant

## 2021-11-01 ENCOUNTER — Ambulatory Visit (INDEPENDENT_AMBULATORY_CARE_PROVIDER_SITE_OTHER): Payer: BC Managed Care – PPO | Admitting: Physician Assistant

## 2021-11-01 DIAGNOSIS — F411 Generalized anxiety disorder: Secondary | ICD-10-CM

## 2021-11-01 DIAGNOSIS — R454 Irritability and anger: Secondary | ICD-10-CM | POA: Diagnosis not present

## 2021-11-01 DIAGNOSIS — F319 Bipolar disorder, unspecified: Secondary | ICD-10-CM

## 2021-11-01 DIAGNOSIS — Z566 Other physical and mental strain related to work: Secondary | ICD-10-CM | POA: Diagnosis not present

## 2021-11-01 MED ORDER — OXCARBAZEPINE 600 MG PO TABS: ORAL_TABLET | ORAL | 1 refills | Status: DC

## 2021-11-01 MED ORDER — ALPRAZOLAM 0.5 MG PO TABS
0.5000 mg | ORAL_TABLET | Freq: Two times a day (BID) | ORAL | 0 refills | Status: DC | PRN
Start: 2021-11-01 — End: 2022-02-26

## 2021-11-01 NOTE — Progress Notes (Signed)
[facility] Med Check  Patient ID: Gregory Howe,  MRN: [id number]  PCP: Elyn Aquas  Date of Evaluation: 11/01/2021 time spent:30 minutes  Chief Complaint:  Chief Complaint   Anxiety; Follow-up     HISTORY/CURRENT STATUS: For routine med check.  Had rough few weeks. Work has been very stressful, has been working more hours b/c another guy has been on vacation. Called out one day last week b/c he just couldn't deal with it. He got really angry twice in the past 10 days. Once he hit the wall with his fist at home, and once he hit something at work, "I've been trying not to do that for 20 years." No trouble from work for that behavior. Wife had him go back on Tegretol, 8 days ago, after we d/c it in Nov, thinking it wasn't working.  States he never felt like it worked anyway.  Under the circumstances he does not think that if he had been taking the Tegretol it would have been beneficial in any way.  Once he stopped it he did not feel any different as far as his mood went.  Work is so stressful that he is now looking for a new job.  He has been trying to hold out until retirement but he just cannot take it anymore.  He has been taking a half Xanax before work most days recently and that does help some.  Not having panic attacks but is just overwhelmed.  He has lost interest in things he usually likes.  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  He was thinking logical enough to take pictures before he erased it all but he had just given up.  States he is not suicidal but he was fed up with work.  ADLs and personal hygiene are normal.  Appetite has not changed.  Weight is stable.  No extreme sadness, tearfulness, or feelings of hopelessness.  Sleeps well most of the time.  Denies any changes in concentration, making decisions or remembering things. Denies suicidal or homicidal thoughts.  Patient denies increased energy with decreased need for sleep,  no increased talkativeness, no racing thoughts, no impulsivity or risky behaviors, no increased spending, no increased libido, no grandiosity, no paranoia, and no hallucinations.  He saw Dr.Jennifer Chima, neurologist, last month for evaluation of headaches.  He was restarted on Emgality and is doing much better.  Also given Maxalt for as needed use.  He has taking it twice in the past 2 months and it was helpful both times.  MRI was ordered.  He states he does not know results.  (I looked under imaging and no results present.)  He will be following up with neurology in August.  Denies dizziness, syncope, seizures, numbness, tingling, tremor, tics, unsteady gait, slurred speech, confusion. Denies muscle or joint pain, stiffness, or dystonia.  Individual Medical History/ Review of Systems: Changes? :No     Past medications for mental health diagnoses include: Latuda, Depakote, Lexapro, Lamictal, Ambien, Risperdal, Xanax, Tegretol was not effective.  Seroquel caused severe sedation, Zoloft, gabapentin at higher doses caused blurred vision and dizziness. Modafinil caused dizziness and headaches.   Allergies: Lamictal [lamotrigine] and Modafinil  Current Medications:  Current Outpatient Medications:    Cholecalciferol (VITAMIN D3) 125 MCG (5000 UT) CAPS, Take 1 tablet by mouth daily., Disp: , Rfl:    gabapentin (NEURONTIN) 400 MG capsule, Take 1 capsule (400 mg total) by mouth 2 (two) times daily., Disp:  180 capsule, Rfl: 3   Galcanezumab-gnlm (EMGALITY) 120 MG/ML SOAJ, Inject 120 mg into the skin every 30 (thirty) days., Disp: 1.12 mL, Rfl: 3   metFORMIN (GLUCOPHAGE) 500 MG tablet, Take 500 mg by mouth 2 (two) times daily with a meal. Metformin ER- 2 in the am, 2 in the pm, Disp: , Rfl:    metoprolol tartrate (LOPRESSOR) 50 MG tablet, 1 tablet in the am, 1/2 tablet in pm, Disp: , Rfl:    Multiple Vitamin (MULTIVITAMIN) tablet, Take 1 tablet by mouth daily., Disp: , Rfl:    Omega-3 Fatty Acids  (OMEGA 3 500 PO), Take 500 mg by mouth., Disp: , Rfl:    omeprazole (PRILOSEC) 40 MG capsule, Take 40 mg by mouth daily., Disp: , Rfl:    oxcarbazepine (TRILEPTAL) 600 MG tablet, 1/2 po bid for 4 days, then 1/2 po q am and 1 qhs for 4 days, then 1 po bid., Disp: 60 tablet, Rfl: 1   rizatriptan (MAXALT) 10 MG tablet, Take 1 tablet (10 mg total) by mouth as needed for migraine. May repeat in 2 hours if needed, Disp: 10 tablet, Rfl: 3   ALPRAZolam (XANAX) 0.5 MG tablet, Take 1 tablet (0.5 mg total) by mouth 2 (two) times daily as needed. for anxiety, Disp: 180 tablet, Rfl: 0   Cholestyramine POWD, by Does not apply route. 1/2 scoop per day (Patient not taking: Reported on 07/03/2021), Disp: , Rfl:    lisinopril (PRINIVIL,ZESTRIL) 5 MG tablet, , Disp: , Rfl:    meloxicam (MOBIC) 15 MG tablet, Take by mouth. (Patient not taking: Reported on 11/01/2021), Disp: , Rfl:  Medication Side Effects: none      Family Medical/ Social History: Changes?  No   MENTAL HEALTH EXAM:  There were no vitals taken for this visit.There is no height or weight on file to calculate BMI.  General Appearance: Casual, Neat, Well Groomed and Obese  Eye Contact:  Good  Speech:  Clear and Coherent  Volume:  Normal  Mood:  Euthymic  Affect:  Congruent  Thought Process:  Goal Directed and Descriptions of Associations: Circumstantial  Orientation:  Full (Time, Place, and Person)  Thought Content: Logical   Suicidal Thoughts:  No  Homicidal Thoughts:  No  Memory:  WNL  Judgement:  Good  Insight:  Good  Psychomotor Activity:  Normal  Concentration:  Concentration: Good and Attention Span: Good  Recall:  Good  Fund of Knowledge: Good  Language: Good  Assets:  Desire for Improvement Financial Resources/Insurance Housing Social Support Talents/Skills Transportation Vocational/Educational  ADL's:  Intact  Cognition: WNL  Prognosis:  Good   Labs 04/10/2021 reviewed.  See on chart  DIAGNOSES:    ICD-10-CM   1.  Generalized anxiety disorder  F41.1     2. Bipolar I disorder ([facility])  F31.9     3. Work stress  Z56.6     4. Irritability and anger  R45.4       Receiving Psychotherapy: No    RECOMMENDATIONS:  PDMP reviewed.  Modafinil filled 03/08/2021.  Last gabapentin filled 08/23/2021.  Last Xanax filled 07/03/2021.  I provided 30 minutes of face to face time during this encounter, including time spent before and after the visit in records review, medical decision making, counseling pertinent to today's visit, and charting.  We discussed his symptoms.  His wife had him restart the Tegretol a week ago but he states it never worked, he has taken Depakote in the past, many years ago and thinks  he had increased headaches from it.  We had previously discussed Trileptal in the past as an option, since the Tegretol has not been effective in the past we planned to stop that and start Trileptal.  Benefits, risks and side effects were discussed and he accepts.  I will not check labs yet, but he understands they will be ordered next month as long as he is tolerating the medication okay. 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  He has not had a DOT physical in a while though and has 1 set up soon.  He needs a note from me stating that I prescribed the Xanax and gabapentin.  I wrote this out on a prescription stating "Zaidon is prescribed Xanax and gabapentin by me.  Also starting Trileptal today."  If he needs something more formal on letterhead he will let me know.  If he has to start driving a truck again to get out of [place] he will.  Discontinue Tegretol. Continue Xanax 0.5 mg, 1/2-1 twice daily as needed. Continue gabapentin 400 mg, 1 p.o. twice daily. Continue Emgality monthly per neurology. Start Trileptal 600 mg, one half p.o. twice daily for 4 days, then one half p.o. every morning and 1 p.o. nightly for 4 days and then 1 p.o. twice daily.  If he has too much sedation or any other side  effects and cannot increase the dose that is fine, I have asked him to call and let me know . Continue Maxalt as needed per neurology. Return in 4 weeks.  Donnal Moat, PA-C

## 2021-11-29 ENCOUNTER — Ambulatory Visit: Payer: BC Managed Care – PPO | Admitting: Physician Assistant

## 2021-12-27 ENCOUNTER — Encounter: Payer: Self-pay | Admitting: Psychiatry

## 2021-12-31 ENCOUNTER — Telehealth: Payer: Self-pay | Admitting: *Deleted

## 2021-12-31 NOTE — Telephone Encounter (Signed)
Emgality approved 12/31/2021 - 07/03/2022. Sent patient my chart to advise.

## 2021-12-31 NOTE — Telephone Encounter (Addendum)
Emgality PA, Key: [id number], faxed notes to be attached to key.  Received e mail: documents attached. Answered more clinical questions.  Your information has been sent to [facility].

## 2022-01-08 ENCOUNTER — Encounter (INDEPENDENT_AMBULATORY_CARE_PROVIDER_SITE_OTHER): Payer: Self-pay

## 2022-01-27 ENCOUNTER — Other Ambulatory Visit: Payer: Self-pay | Admitting: Psychiatry

## 2022-01-30 ENCOUNTER — Ambulatory Visit: Payer: BC Managed Care – PPO | Admitting: Physician Assistant

## 2022-01-30 ENCOUNTER — Ambulatory Visit (INDEPENDENT_AMBULATORY_CARE_PROVIDER_SITE_OTHER): Payer: BC Managed Care – PPO | Admitting: Psychiatry

## 2022-01-30 VITALS — BP 121/85 | HR 76 | Ht 70.0 in | Wt 254.1 lb

## 2022-01-30 DIAGNOSIS — G43119 Migraine with aura, intractable, without status migrainosus: Secondary | ICD-10-CM

## 2022-01-30 MED ORDER — EMGALITY 120 MG/ML ~~LOC~~ SOAJ: 1.0000 | SUBCUTANEOUS | 11 refills | Status: DC

## 2022-01-30 NOTE — Progress Notes (Signed)
   CC:  headaches  Follow-up Visit  Last visit: 09/03/21  Brief HPI: 56 year old male with a history of anxiety, Bipolar disorder, DM2, HTN who follows in clinic for migraines.  At his last visit MRI brain was ordered. Emgality was restarted for prevention and Maxalt was started for rescue.  Interval History: His migraines have improved since restarting Emgality. Has not had any severe migraines in the past month. Picked up Maxalt but has not had to use it. Will sometimes get headaches if he looks at computer screens for too long, but these typically improve if he lies down.  Brain MRI was normal other than evidence of ethmoid sinus disease.   Current Headache Regimen: Preventative: Emgality 120 mg monthly Abortive: Maxalt 10 mg PRN  Prior Therapies                                  Lamictal Gabapentin 400 mg BID Tegretol 100 mg BID Lisinopril 5 mg daily Cymbalta  Topamax Metoprolol 50/25 Ajovy - lack of efficacy Emgality Aimovig - nausea Botox Maxalt 10 mg PRN  Physical Exam:   Vital Signs: BP 121/85   Pulse 76   Ht 5\' 10"  (1.778 m)   Wt 254 lb 2 oz (115.3 kg)   BMI 36.46 kg/m  GENERAL:  well appearing, in no acute distress, alert  SKIN:  Color, texture, turgor normal. No rashes or lesions HEAD:  Normocephalic/atraumatic. RESP: normal respiratory effort MSK:  No gross joint deformities.   NEUROLOGICAL: Mental Status: Alert, oriented to person, place and time, Follows commands, and Speech fluent and appropriate. Cranial Nerves: PERRL, face symmetric, no dysarthria, hearing grossly intact Motor: moves all extremities equally Gait: normal-based.  IMPRESSION: 56 year old male with a history of anxiety, Bipolar disorder, DM2, HTN who presents for follow up of migraines. MRI brain was normal. His migraines have improved with Emgality and he has not needed to use Maxalt for rescue. Will continue current regimen for now.  PLAN: -Preventive: Continue Emgality 120 mg  monthly -Rescue: Continue Maxalt 10 mg PRN   Follow-up: 1 year or sooner if needed  I spent a total of 13 minutes on the date of the service. Discussed medication side effects, adverse reactions and drug interactions. Written educational materials and patient instructions outlining all of the above were given.  58, MD 01/30/22 1:48 PM

## 2022-02-26 ENCOUNTER — Ambulatory Visit (INDEPENDENT_AMBULATORY_CARE_PROVIDER_SITE_OTHER): Payer: BC Managed Care – PPO | Admitting: Physician Assistant

## 2022-02-26 ENCOUNTER — Encounter: Payer: Self-pay | Admitting: Physician Assistant

## 2022-02-26 DIAGNOSIS — Z79899 Other long term (current) drug therapy: Secondary | ICD-10-CM | POA: Diagnosis not present

## 2022-02-26 DIAGNOSIS — F411 Generalized anxiety disorder: Secondary | ICD-10-CM | POA: Diagnosis not present

## 2022-02-26 DIAGNOSIS — F319 Bipolar disorder, unspecified: Secondary | ICD-10-CM

## 2022-02-26 MED ORDER — ALPRAZOLAM 0.5 MG PO TABS: 0.5000 mg | ORAL_TABLET | Freq: Two times a day (BID) | ORAL | 1 refills | Status: DC | PRN

## 2022-02-26 MED ORDER — CARBAMAZEPINE 200 MG PO TABS: 200.0000 mg | ORAL_TABLET | Freq: Two times a day (BID) | ORAL | 1 refills | Status: DC

## 2022-02-26 NOTE — Progress Notes (Signed)
[facility]  Patient ID: Gregory Howe,  MRN: [id number]  PCP: Elyn Aquas  Date of Evaluation: 02/26/2022 time spent:30 minutes  Chief Complaint:  Chief Complaint   Anxiety; Depression; Follow-up    HISTORY/CURRENT STATUS: For routine med check.  D/c Trileptal at [place]. It caused dizziness and n/v.  He stopped it and his wife put him back on the Tegretol.  He has been back on it for a month or so.  States he is feeling better as far as his mood goes.  Work is still extremely stressful and is the biggest problem in his life.  There is just so much negativity at [facility].   Patient is able to enjoy things.  Energy and motivation are good.  No extreme sadness, tearfulness, or feelings of hopelessness.  Sleeps well most of the time.  Takes the Xanax before bed which helps him relax to go to sleep.  He needs the Xanax sometimes during the day but forgets he has it so does not take it very often.  He is not having panic attacks but does have generalized sense of unease like something bad is going to happen at times.  ADLs and personal hygiene are normal.   Denies any changes in concentration, making decisions, or remembering things.  Appetite has not changed.  Weight is stable.  Denies suicidal or homicidal thoughts.  Patient denies increased energy with decreased need for sleep, increased talkativeness, racing thoughts, impulsivity or risky behaviors, increased spending, increased libido, grandiosity, increased irritability or anger, paranoia, or hallucinations.  Denies dizziness, syncope, seizures, numbness, tingling, tremor, tics, unsteady gait, slurred speech, confusion. Denies muscle or joint pain, stiffness, or dystonia.  Individual Medical History/ Review of Systems: Changes? :No     Past medications for mental health diagnoses include: Latuda, Depakote, Lexapro, Lamictal, Ambien, Risperdal, Xanax, Tegretol was not effective.  Seroquel caused severe sedation, Zoloft,  gabapentin at higher doses caused blurred vision and dizziness. Modafinil caused dizziness and headaches. Trileptal caused nausea and severe h/a  Allergies: Trileptal [oxcarbazepine], Lamictal [lamotrigine], and Modafinil  Current Medications:  Current Outpatient Medications:    baclofen (LIORESAL) 10 MG tablet, Take 10 mg by mouth 3 (three) times daily as needed., Disp: , Rfl:    Cholecalciferol (VITAMIN D3) 125 MCG (5000 UT) CAPS, Take 1 tablet by mouth daily., Disp: , Rfl:    Cholestyramine POWD, by Does not apply route. 1/2 scoop per day, Disp: , Rfl:    gabapentin (NEURONTIN) 400 MG capsule, Take 1 capsule (400 mg total) by mouth 2 (two) times daily., Disp: 180 capsule, Rfl: 3   Galcanezumab-gnlm (EMGALITY) 120 MG/ML SOAJ, Inject 1 Pen into the skin every 30 (thirty) days., Disp: 1.12 mL, Rfl: 11   lisinopril (PRINIVIL,ZESTRIL) 5 MG tablet, , Disp: , Rfl:    meloxicam (MOBIC) 15 MG tablet, Take by mouth., Disp: , Rfl:    metFORMIN (GLUCOPHAGE) 500 MG tablet, Take 500 mg by mouth 2 (two) times daily with a meal. Metformin ER- 2 in the am, 2 in the pm, Disp: , Rfl:    metoprolol tartrate (LOPRESSOR) 50 MG tablet, 1 tablet in the am, 1/2 tablet in pm, Disp: , Rfl:    Multiple Vitamin (MULTIVITAMIN) tablet, Take 1 tablet by mouth daily., Disp: , Rfl:    Omega-3 Fatty Acids (OMEGA 3 500 PO), Take 500 mg by mouth., Disp: , Rfl:    omeprazole (PRILOSEC) 40 MG capsule, Take 40 mg by mouth daily., Disp: , Rfl:    rizatriptan (  MAXALT) 10 MG tablet, Take 1 tablet (10 mg total) by mouth as needed for migraine. May repeat in 2 hours if needed, Disp: 10 tablet, Rfl: 3   ALPRAZolam (XANAX) 0.5 MG tablet, Take 1 tablet (0.5 mg total) by mouth 2 (two) times daily as needed. for anxiety, Disp: 180 tablet, Rfl: 1   carbamazepine (TEGRETOL) 200 MG tablet, Take 1 tablet (200 mg total) by mouth 2 (two) times daily., Disp: 180 tablet, Rfl: 1 Medication Side Effects: none      Family Medical/ Social History:  Changes?  No  MENTAL HEALTH EXAM:  There were no vitals taken for this visit.There is no height or weight on file to calculate BMI.  General Appearance: Casual, Neat, Well Groomed and Obese  Eye Contact:  Good  Speech:  Clear and Coherent  Volume:  Normal  Mood:  Euthymic  Affect:  Congruent  Thought Process:  Goal Directed and Descriptions of Associations: Circumstantial  Orientation:  Full (Time, Place, and Person)  Thought Content: Logical   Suicidal Thoughts:  No  Homicidal Thoughts:  No  Memory:  WNL  Judgement:  Good  Insight:  Good  Psychomotor Activity:  Normal  Concentration:  Concentration: Good and Attention Span: Good  Recall:  Good  Fund of Knowledge: Good  Language: Good  Assets:  Desire for Improvement Financial Resources/Insurance Housing Transportation  ADL's:  Intact  Cognition: WNL  Prognosis:  Good    DIAGNOSES:    ICD-10-CM   1. Bipolar I disorder (HCC)  F31.9 Carbamazepine level, total    Comprehensive metabolic panel    CBC with Differential/Platelet    2. Generalized anxiety disorder  F41.1     3. Encounter for long-term (current) use of medications  Z79.899 Carbamazepine level, total    Comprehensive metabolic panel    CBC with Differential/Platelet      Receiving Psychotherapy: No   RECOMMENDATIONS:  PDMP reviewed.  Gabapentin filled 02/04/2022.  Xanax filled 11/04/2021. I provided 30 minutes of face to face time during this encounter, including time spent before and after the visit in records review, medical decision making, counseling pertinent to today's visit, and charting.   He is doing fine on current medications so no changes will be made.  Continue Xanax 0.5 mg, 1/2-1 twice daily as needed. Continue Tegretol 200 mg, 1 p.o. twice daily. Continue gabapentin 400 mg, 1 p.o. twice daily. Continue Emgality monthly per neurology. Continue Maxalt as needed per neurology. Labs ordered as above. Return in 3 months.  Donnal Moat,  PA-C

## 2022-02-28 ENCOUNTER — Encounter: Payer: Self-pay | Admitting: Physician Assistant

## 2022-03-01 LAB — COMPREHENSIVE METABOLIC PANEL
ALT: 28 IU/L (ref 0–44)
AST: 20 IU/L (ref 0–40)
Albumin/Globulin Ratio: 2 (ref 1.2–2.2)
Albumin: 4.5 g/dL (ref 3.8–4.9)
Alkaline Phosphatase: 62 IU/L (ref 44–121)
BUN/Creatinine Ratio: 15 (ref 9–20)
BUN: 16 mg/dL (ref 6–24)
Bilirubin Total: 0.2 mg/dL (ref 0.0–1.2)
CO2: 23 mmol/L (ref 20–29)
Calcium: 10 mg/dL (ref 8.7–10.2)
Chloride: 98 mmol/L (ref 96–106)
Creatinine, Ser: 1.06 mg/dL (ref 0.76–1.27)
Globulin, Total: 2.3 g/dL (ref 1.5–4.5)
Glucose: 133 mg/dL — ABNORMAL HIGH (ref 70–99)
Potassium: 4.5 mmol/L (ref 3.5–5.2)
Sodium: 138 mmol/L (ref 134–144)
Total Protein: 6.8 g/dL (ref 6.0–8.5)
eGFR: 83 mL/min/{1.73_m2} (ref >59)

## 2022-03-01 LAB — CBC WITH DIFFERENTIAL/PLATELET
Basophils Absolute: 0.1 10*3/uL (ref 0.0–0.2)
Basos: 1 %
EOS (ABSOLUTE): 0.3 10*3/uL (ref 0.0–0.4)
Eos: 5 %
Hematocrit: 44.1 % (ref 37.5–51.0)
Hemoglobin: 15.8 g/dL (ref 13.0–17.7)
Immature Grans (Abs): 0 10*3/uL (ref 0.0–0.1)
Immature Granulocytes: 0 %
Lymphocytes Absolute: 1.2 10*3/uL (ref 0.7–3.1)
Lymphs: 22 %
MCH: 32.7 pg (ref 26.6–33.0)
MCHC: 35.8 g/dL — ABNORMAL HIGH (ref 31.5–35.7)
MCV: 91 fL (ref 79–97)
Monocytes Absolute: 0.4 10*3/uL (ref 0.1–0.9)
Monocytes: 8 %
Neutrophils Absolute: 3.6 10*3/uL (ref 1.4–7.0)
Neutrophils: 64 %
Platelets: 185 10*3/uL (ref 150–450)
RBC: 4.83 x10E6/uL (ref 4.14–5.80)
RDW: 12 % (ref 11.6–15.4)
WBC: 5.6 10*3/uL (ref 3.4–10.8)

## 2022-03-01 LAB — CARBAMAZEPINE LEVEL, TOTAL: Carbamazepine (Tegretol), S: 8.8 ug/mL (ref 4.0–12.0)

## 2022-03-03 NOTE — Progress Notes (Signed)
Noted  

## 2022-03-03 NOTE — Progress Notes (Signed)
Please let him know CBC is normal, blood sugar was elevated at 133 but that is okay if this was a random draw, if it was fasting, he should discuss with his PCP, liver and kidney test were fine as were electrolytes.  Tegretol level was 8.8 which is good. No change in treatment. Thank you.

## 2022-03-30 ENCOUNTER — Other Ambulatory Visit: Payer: Self-pay | Admitting: Physician Assistant

## 2022-05-21 ENCOUNTER — Ambulatory Visit: Payer: BC Managed Care – PPO | Admitting: Physician Assistant

## 2022-05-28 ENCOUNTER — Other Ambulatory Visit: Payer: Self-pay | Admitting: Psychiatry

## 2022-06-04 ENCOUNTER — Encounter: Payer: Self-pay | Admitting: Psychiatry

## 2022-06-04 ENCOUNTER — Other Ambulatory Visit: Payer: Self-pay | Admitting: Neurology

## 2022-06-04 MED ORDER — EMGALITY 120 MG/ML ~~LOC~~ SOAJ: 1.0000 | SUBCUTANEOUS | 7 refills | Status: DC

## 2022-06-23 ENCOUNTER — Other Ambulatory Visit: Payer: Self-pay | Admitting: Physician Assistant

## 2022-07-02 ENCOUNTER — Encounter: Payer: Self-pay | Admitting: Physician Assistant

## 2022-07-02 ENCOUNTER — Ambulatory Visit (INDEPENDENT_AMBULATORY_CARE_PROVIDER_SITE_OTHER): Payer: BC Managed Care – PPO | Admitting: Physician Assistant

## 2022-07-02 DIAGNOSIS — F411 Generalized anxiety disorder: Secondary | ICD-10-CM | POA: Diagnosis not present

## 2022-07-02 DIAGNOSIS — F319 Bipolar disorder, unspecified: Secondary | ICD-10-CM | POA: Diagnosis not present

## 2022-07-02 DIAGNOSIS — H538 Other visual disturbances: Secondary | ICD-10-CM | POA: Diagnosis not present

## 2022-07-02 DIAGNOSIS — G43109 Migraine with aura, not intractable, without status migrainosus: Secondary | ICD-10-CM | POA: Diagnosis not present

## 2022-07-02 DIAGNOSIS — Z566 Other physical and mental strain related to work: Secondary | ICD-10-CM

## 2022-07-02 MED ORDER — CARBAMAZEPINE 200 MG PO TABS: 200.0000 mg | ORAL_TABLET | Freq: Two times a day (BID) | ORAL | 1 refills | Status: DC

## 2022-07-02 NOTE — Progress Notes (Signed)
[facility]  Patient ID: Gregory Howe,  MRN: [id number]  PCP: Elyn Aquas  Date of Evaluation: 07/02/2022 time spent:20 minutes  Chief Complaint:  Chief Complaint   Anxiety; Depression; Follow-up    HISTORY/CURRENT STATUS: For routine med check.  Feels like meds are working well.  States they keep his moods as stable as they can be.  His work is still very stressful which is a huge factor in his mental illness.   Patient is able to enjoy things.  Energy and motivation are good.  No extreme sadness, tearfulness, or feelings of hopelessness.  Sleeps well most of the time.  He takes the Xanax at night to help him relax to go to sleep, especially on work nights.  There are occasions he needs it during the day however and it is still effective.  He is not having panic attacks but just gets overwhelmed.  ADLs and personal hygiene are normal.   Denies any changes in concentration, making decisions, or remembering things.  Appetite has not changed.  Weight is stable.  Denies suicidal or homicidal thoughts.  Patient denies increased energy with decreased need for sleep, increased talkativeness, racing thoughts, impulsivity or risky behaviors, increased spending, increased libido, grandiosity, increased irritability or anger, paranoia, or hallucinations.  Migraines are much better since he has been back on Emgality.  He has noticed some blurred vision though.  It has not been bad enough to affect his work, driving, or reading as long as he has his glasses on.  He feels like there is a film over both eyes.  Denies dizziness, syncope, seizures, numbness, tingling, tremor, tics, unsteady gait, slurred speech, confusion. Denies muscle or joint pain, stiffness, or dystonia.  Individual Medical History/ Review of Systems: Changes? :No     Past medications for mental health diagnoses include: Latuda, Depakote, Lexapro, Lamictal, Ambien, Risperdal, Xanax, Tegretol was not effective.   Seroquel caused severe sedation, Zoloft, gabapentin at higher doses caused blurred vision and dizziness. Modafinil caused dizziness and headaches. Trileptal caused nausea and severe h/a  Allergies: Trileptal [oxcarbazepine], Lamictal [lamotrigine], and Modafinil  Current Medications:  Current Outpatient Medications:    ALPRAZolam (XANAX) 0.5 MG tablet, Take 1 tablet (0.5 mg total) by mouth 2 (two) times daily as needed. for anxiety, Disp: 180 tablet, Rfl: 1   baclofen (LIORESAL) 10 MG tablet, Take 10 mg by mouth 3 (three) times daily as needed., Disp: , Rfl:    Cholecalciferol (VITAMIN D3) 125 MCG (5000 UT) CAPS, Take 1 tablet by mouth daily., Disp: , Rfl:    gabapentin (NEURONTIN) 400 MG capsule, TAKE 1 CAPSULE BY MOUTH TWICE  DAILY, Disp: 180 capsule, Rfl: 0   Galcanezumab-gnlm (EMGALITY) 120 MG/ML SOAJ, Inject 1 Pen into the skin every 30 (thirty) days., Disp: 1 mL, Rfl: 7   lisinopril (PRINIVIL,ZESTRIL) 5 MG tablet, , Disp: , Rfl:    meloxicam (MOBIC) 15 MG tablet, Take by mouth., Disp: , Rfl:    metFORMIN (GLUCOPHAGE) 500 MG tablet, Take 500 mg by mouth 2 (two) times daily with a meal. Metformin ER- 2 in the am, 2 in the pm, Disp: , Rfl:    metoprolol tartrate (LOPRESSOR) 50 MG tablet, 1 tablet in the am, 1/2 tablet in pm, Disp: , Rfl:    Multiple Vitamin (MULTIVITAMIN) tablet, Take 1 tablet by mouth daily., Disp: , Rfl:    Omega-3 Fatty Acids (OMEGA 3 500 PO), Take 500 mg by mouth., Disp: , Rfl:    omeprazole (PRILOSEC) 40 MG capsule,  Take 40 mg by mouth daily., Disp: , Rfl:    rizatriptan (MAXALT) 10 MG tablet, Take 1 tablet (10 mg total) by mouth as needed for migraine. May repeat in 2 hours if needed, Disp: 10 tablet, Rfl: 3   carbamazepine (TEGRETOL) 200 MG tablet, Take 1 tablet (200 mg total) by mouth 2 (two) times daily., Disp: 180 tablet, Rfl: 1   Cholestyramine POWD, by Does not apply route. 1/2 scoop per day (Patient not taking: Reported on 07/02/2022), Disp: , Rfl:  Medication  Side Effects: none      Family Medical/ Social History: Changes?  No  MENTAL HEALTH EXAM:  There were no vitals taken for this visit.There is no height or weight on file to calculate BMI.  General Appearance: Casual, Neat, Well Groomed and Obese  Eye Contact:  Good  Speech:  Clear and Coherent  Volume:  Normal  Mood:  Euthymic  Affect:  Congruent  Thought Process:  Goal Directed and Descriptions of Associations: Circumstantial  Orientation:  Full (Time, Place, and Person)  Thought Content: Logical   Suicidal Thoughts:  No  Homicidal Thoughts:  No  Memory:  WNL  Judgement:  Good  Insight:  Good  Psychomotor Activity:  Normal  Concentration:  Concentration: Good and Attention Span: Good  Recall:  Good  Fund of Knowledge: Good  Language: Good  Assets:  Desire for Improvement Financial Resources/Insurance Housing Transportation Vocational/Educational  ADL's:  Intact  Cognition: WNL  Prognosis:  Good   DIAGNOSES:    ICD-10-CM   1. Bipolar I disorder ([place])  F31.9     2. Generalized anxiety disorder  F41.1     3. Migraine with aura and without status migrainosus, not intractable  G43.109     4. Blurred vision, bilateral  H53.8     5. Work stress  Z56.6       Receiving Psychotherapy: No   RECOMMENDATIONS:  PDMP reviewed.  Gabapentin filled 04/08/2022.  Xanax filled 05/21/2022. I provided 20 minutes of face to face time during this encounter, including time spent before and after the visit in records review, medical decision making, counseling pertinent to today's visit, and charting.   The blurred vision is possibly coming from Tegretol and may be gabapentin as well.  His mental health is stable right now and I prefer not to have to make any changes in those medications if we can help it.  He has a trusted optometrist so I have asked him to make an appointment with them as soon as he can get in.  If we have to switch Tegretol and/or gabapentin I would most likely try a  different antipsychotic.  Depakote caused elevated ammonia level and he thinks blurred vision as well.  Trileptal caused worse headaches.  I do not think he was on Emgality at that time though.  So that may be an option.  Hopefully we will not have to worry about it though.  He will make an appointment with me after he sees his optometrist.  Continue Xanax 0.5 mg, 1/2-1 twice daily as needed. Continue Tegretol 200 mg, 1 p.o. twice daily. Continue gabapentin 400 mg, 1 p.o. twice daily. Continue Emgality monthly per neurology. Continue Maxalt as needed per neurology. Return in 6 months.  Also depends on him and he can see the optometrist.  Donnal Moat, PA-C

## 2022-08-04 ENCOUNTER — Encounter: Payer: Self-pay | Admitting: Psychiatry

## 2022-08-04 NOTE — Telephone Encounter (Signed)
Called [facility] at [phone]. Spoke w/ tech. Confirmed PA Emgality needed. Submitted urgent PA request on covermymeds. Key: [id number]. Waiting on determination from [employer].  RxBIN: [id number] PCN: [facility]um  IDLE:[id number] Phone: [phone]

## 2022-08-04 NOTE — Telephone Encounter (Signed)
I'm not sure what he's referring to re an MRI in June. Since his last brain MRI was normal he shouldn't need another MRI done.

## 2022-08-06 NOTE — Telephone Encounter (Signed)
PA denied. Submitted urgent appeal request to [employer]. Fax: [phone]. Received fax confirmation, waiting on determination.

## 2022-08-28 ENCOUNTER — Other Ambulatory Visit: Payer: Self-pay | Admitting: Psychiatry

## 2022-08-28 NOTE — Telephone Encounter (Signed)
Last seen on 01/23/22 Follow up scheduled on 02/05/23

## 2022-09-05 ENCOUNTER — Other Ambulatory Visit: Payer: Self-pay | Admitting: Psychiatry

## 2022-09-08 NOTE — Telephone Encounter (Signed)
Last seen on 01/30/22 Follow up scheduled on 02/05/23

## 2022-09-15 ENCOUNTER — Ambulatory Visit (INDEPENDENT_AMBULATORY_CARE_PROVIDER_SITE_OTHER): Payer: BC Managed Care – PPO | Admitting: Physician Assistant

## 2022-09-15 ENCOUNTER — Encounter: Payer: Self-pay | Admitting: Physician Assistant

## 2022-09-15 DIAGNOSIS — G43109 Migraine with aura, not intractable, without status migrainosus: Secondary | ICD-10-CM | POA: Diagnosis not present

## 2022-09-15 DIAGNOSIS — F3164 Bipolar disorder, current episode mixed, severe, with psychotic features: Secondary | ICD-10-CM

## 2022-09-15 DIAGNOSIS — F411 Generalized anxiety disorder: Secondary | ICD-10-CM

## 2022-09-15 DIAGNOSIS — Z566 Other physical and mental strain related to work: Secondary | ICD-10-CM | POA: Diagnosis not present

## 2022-09-15 MED ORDER — LYBALVI 10-10 MG PO TABS: 1.0000 | ORAL_TABLET | Freq: Every evening | ORAL | 0 refills | Status: DC
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
## 2022-09-18 ENCOUNTER — Telehealth: Payer: Self-pay | Admitting: Neurology

## 2022-09-18 NOTE — Telephone Encounter (Signed)
LVM and sent mychart msg informing pt of appointment change 

## 2022-09-23 ENCOUNTER — Other Ambulatory Visit: Payer: Self-pay | Admitting: Physician Assistant

## 2022-09-23 ENCOUNTER — Telehealth: Payer: Self-pay | Admitting: Psychiatry

## 2022-09-23 NOTE — Telephone Encounter (Signed)
LVM and sent mychart msg informing pt of appointment change due to MD being out 

## 2022-09-24 ENCOUNTER — Ambulatory Visit: Payer: BC Managed Care – PPO | Admitting: Psychiatry

## 2022-09-24 ENCOUNTER — Ambulatory Visit: Payer: BC Managed Care – PPO | Admitting: Neurology

## 2022-09-29 ENCOUNTER — Encounter: Payer: Self-pay | Admitting: Physician Assistant

## 2022-09-29 ENCOUNTER — Ambulatory Visit (INDEPENDENT_AMBULATORY_CARE_PROVIDER_SITE_OTHER): Payer: BC Managed Care – PPO | Admitting: Physician Assistant

## 2022-09-29 DIAGNOSIS — Z566 Other physical and mental strain related to work: Secondary | ICD-10-CM

## 2022-09-29 DIAGNOSIS — F411 Generalized anxiety disorder: Secondary | ICD-10-CM

## 2022-09-29 DIAGNOSIS — F319 Bipolar disorder, unspecified: Secondary | ICD-10-CM

## 2022-09-29 DIAGNOSIS — F3160 Bipolar disorder, current episode mixed, unspecified: Secondary | ICD-10-CM | POA: Diagnosis not present

## 2022-09-29 NOTE — Progress Notes (Signed)
[facility]  Patient ID: Gregory Howe,  MRN: [id number]  PCP: Joya [name]  Date of Evaluation: 09/29/2022 time spent:20 minutes  Chief Complaint:  Chief Complaint   Depression; Anxiety    HISTORY/CURRENT STATUS: For 2 wk med check  At the last visit Lybalvi was added.  He started feeling better in a few days.  Is more 'clear' in his thinking, things don't bother him like they did.  Energy and motivation are good.  Work is going well, still very stressful but able to cope better.   No extreme sadness, tearfulness, or feelings of hopelessness.  Sleeps well. ADLs and personal hygiene are normal.   Denies any changes in concentration, making decisions, or remembering things.  Appetite has not changed.  Weight is stable. Still gets anxious and takes Xanax which helps. Not having PA.  No SI at all now. No HI.   Patient denies increased energy with decreased need for sleep, increased talkativeness, racing thoughts, impulsivity or risky behaviors, increased spending, increased libido, grandiosity, increased irritability or anger, paranoia, or hallucinations.  Denies dizziness, syncope, seizures, numbness, tingling, tremor, tics, unsteady gait, slurred speech, confusion. Denies muscle or joint pain, stiffness, or dystonia. Denies unexplained weight loss, frequent infections, or sores that heal slowly.  No polyphagia, polydipsia, or polyuria. Denies visual changes or paresthesias.   Individual Medical History/ Review of Systems: Changes? :No     Past medications for mental health diagnoses include: Latuda, Depakote, Lexapro, Lamictal, Ambien, Risperdal caused nightmares,  Xanax, Tegretol was effective.  Seroquel caused severe sedation, Zoloft, gabapentin at higher doses caused blurred vision and dizziness. Modafinil caused dizziness and headaches. Trileptal caused nausea and severe h/a. Paxil was "awful.'"  Allergies: Trileptal [oxcarbazepine], Lamictal [lamotrigine], and  Modafinil  Current Medications:  Current Outpatient Medications:    ALPRAZolam (XANAX) 0.5 MG tablet, Take 1 tablet (0.5 mg total) by mouth 2 (two) times daily as needed. for anxiety, Disp: 180 tablet, Rfl: 1   baclofen (LIORESAL) 10 MG tablet, Take 10 mg by mouth 3 (three) times daily as needed., Disp: , Rfl:    carbamazepine (TEGRETOL) 200 MG tablet, Take 1 tablet (200 mg total) by mouth 2 (two) times daily., Disp: 180 tablet, Rfl: 1   Cholecalciferol (VITAMIN D3) 125 MCG (5000 UT) CAPS, Take 1 tablet by mouth daily., Disp: , Rfl:    gabapentin (NEURONTIN) 400 MG capsule, TAKE 1 CAPSULE BY MOUTH TWICE  DAILY, Disp: 180 capsule, Rfl: 3   Galcanezumab-gnlm (EMGALITY) 120 MG/ML SOAJ, Inject 1 Pen into the skin every 30 (thirty) days., Disp: 1 mL, Rfl: 7   Galcanezumab-gnlm (EMGALITY) 120 MG/ML SOSY, INJECT 1 ML SUBCUTANEOUSLY  ONCE EVERY MONTH, Disp: 1 mL, Rfl: 4   lisinopril (PRINIVIL,ZESTRIL) 5 MG tablet, , Disp: , Rfl:    meloxicam (MOBIC) 15 MG tablet, Take by mouth., Disp: , Rfl:    metFORMIN (GLUCOPHAGE) 500 MG tablet, Take 500 mg by mouth 2 (two) times daily with a meal. Metformin ER- 2 in the am, 2 in the pm, Disp: , Rfl:    metoprolol tartrate (LOPRESSOR) 50 MG tablet, 1 tablet in the am, 1/2 tablet in pm, Disp: , Rfl:    Multiple Vitamin (MULTIVITAMIN) tablet, Take 1 tablet by mouth daily., Disp: , Rfl:    OLANZapine-Samidorphan (LYBALVI) 10-10 MG TABS, Take 1 tablet by mouth at bedtime., Disp: 30 tablet, Rfl: 0   Omega-3 Fatty Acids (OMEGA 3 500 PO), Take 500 mg by mouth., Disp: , Rfl:    omeprazole (PRILOSEC)  40 MG capsule, Take 40 mg by mouth daily., Disp: , Rfl:    rizatriptan (MAXALT) 10 MG tablet, TAKE 1 TABLET BY MOUTH AS NEEDED FOR MIGRAINE, MAY REPEAT IN 2 HOURS IF NEEDED, Disp: 10 tablet, Rfl: 0 Medication Side Effects: none      Family Medical/ Social History: Changes?  No  MENTAL HEALTH EXAM:  There were no vitals taken for this visit.There is no height or weight on  file to calculate BMI.  General Appearance: Casual, Neat, Well Groomed and Obese  Eye Contact:  Good  Speech:  Clear and Coherent  Volume:  Normal  Mood:  Euthymic  Affect:  Congruent  Thought Process:  Goal Directed and Descriptions of Associations: Circumstantial  Orientation:  Full (Time, Place, and Person)  Thought Content: Logical   Suicidal Thoughts:  No  Homicidal Thoughts:  No  Memory:  WNL  Judgement:  Good  Insight:  Good  Psychomotor Activity:  Normal  Concentration:  Concentration: Good and Attention Span: Good  Recall:  Good  Fund of Knowledge: Good  Language: Good  Assets:  Desire for Improvement Financial Resources/Insurance Housing Transportation Vocational/Educational  ADL's:  Intact  Cognition: WNL  Prognosis:  Good   DIAGNOSES:    ICD-10-CM   1. Mixed bipolar I disorder (HCC)  F31.60     2. Generalized anxiety disorder  F41.1     3. Work stress  Z56.6       Receiving Psychotherapy: No   RECOMMENDATIONS:  PDMP reviewed.  Gabapentin filled 07/09/2022.  Xanax filled 05/24/2022. I provided 20 minutes of face to face time during this encounter, including time spent before and after the visit in records review, medical decision making, counseling pertinent to today's visit, and charting.   I'm glad to see him doing so much better! The Claretha Cooper is working great at 5/10 mg. He states insurance won't pay for it, I'm checking with Gwenyth Ober, LPN who handles PAs to check on it. He wonders if he can split a 10/10 mg to help w/ cost. It's not advised b/c not studied with Samidorphan <10 mg. I think trying it off label is fine, if he can split the pill accurately. He wants to try it. He can let me know in a week or so if effective, pill doesn't crumble, or other probs come up. He was given another 7 day pill pack of 5/10 mg and 10/10 mg samples.  Also discussed whether Tegretol is needed or not. He may not need it, but stay on it for now, so we know how the  Lybalvi does first.   Continue Xanax 0.5 mg, 1/2-1 twice daily as needed. Continue Tegretol 200 mg, 1 p.o. twice daily. Continue gabapentin 400 mg, 1 p.o. twice daily. Continue Emgality monthly per neurology. Continue Lybalvi 5/10 mg qhs. (See above) Continue Maxalt as needed per neurology. Will see Neuro in a few months. Return in 4 weeks.  Melony Overly, PA-C

## 2022-09-30 ENCOUNTER — Telehealth: Payer: Self-pay

## 2022-09-30 NOTE — Telephone Encounter (Signed)
Prior Authorization initiated with [facility] for LYBALVI 5-10 MG, pending response

## 2022-10-01 ENCOUNTER — Ambulatory Visit: Payer: BC Managed Care – PPO | Admitting: Neurology

## 2022-10-06 ENCOUNTER — Other Ambulatory Visit: Payer: Self-pay | Admitting: Physician Assistant

## 2022-10-06 MED ORDER — LYBALVI 5-10 MG PO TABS: 5.0000 mg | ORAL_TABLET | Freq: Every evening | ORAL | 1 refills | Status: DC

## 2022-10-06 NOTE — Telephone Encounter (Signed)
Gregory Howe is aware the PA was approved. Thanks for all your help!

## 2022-10-08 ENCOUNTER — Telehealth: Payer: Self-pay | Admitting: Physician Assistant

## 2022-10-08 NOTE — Telephone Encounter (Signed)
Pharmacy left message need info verified on Rx sent 09/24/22 Gabapentin 100 mg call [phone]  ref # [id number]

## 2022-10-08 NOTE — Telephone Encounter (Signed)
Called pharmacy and provided the requested information.

## 2022-10-28 ENCOUNTER — Encounter: Payer: Self-pay | Admitting: Physician Assistant

## 2022-10-28 ENCOUNTER — Ambulatory Visit (INDEPENDENT_AMBULATORY_CARE_PROVIDER_SITE_OTHER): Payer: BC Managed Care – PPO | Admitting: Physician Assistant

## 2022-10-28 DIAGNOSIS — F411 Generalized anxiety disorder: Secondary | ICD-10-CM | POA: Diagnosis not present

## 2022-10-28 DIAGNOSIS — Z566 Other physical and mental strain related to work: Secondary | ICD-10-CM | POA: Diagnosis not present

## 2022-10-28 DIAGNOSIS — F3160 Bipolar disorder, current episode mixed, unspecified: Secondary | ICD-10-CM | POA: Diagnosis not present

## 2022-10-28 NOTE — Patient Instructions (Signed)
Wean off the Tegretol 200 mg by taking 1/2 pill in the morning and 1 pill at night for 1 week.  Then take 1/2 pill twice a day for 1 week, then 1/2 pill once a day for 1 week and then stop it.

## 2022-10-28 NOTE — Progress Notes (Signed)
[facility]  Patient ID: Gregory Howe,  MRN: [id number]  PCP: Joya [name]  Date of Evaluation: 10/28/2022 Time spent:20 minutes  Chief Complaint:  Chief Complaint   Anxiety; Depression; Insomnia; Follow-up    HISTORY/CURRENT STATUS: For 1 month routine f/u  We started Lybalvi about 6 weeks ago. Has felt much better with his mood.  We made this appointment specifically to discuss getting off Tegretol.  Prior to starting the Lybalvi, the Tegretol did not seem to be effective any longer.    Doesn't feel depressed hardly at all.  Patient is able to enjoy things.  Energy and motivation are good.  Work is just as stressful as every but no worse.  No extreme sadness, tearfulness, or feelings of hopelessness.  Sleeps well, the Lybalvi does make him groggy the next morning.  It is tolerable though.  ADLs and personal hygiene are normal.   Denies any changes in concentration, making decisions, or remembering things.  Appetite has not changed.  Weight is stable.  Anxiety is well controlled, he does not often take the Xanax but when he does it is effective.  Denies suicidal or homicidal thoughts.  Patient denies increased energy with decreased need for sleep, increased talkativeness, racing thoughts, impulsivity or risky behaviors, increased spending, increased libido, grandiosity, increased irritability or anger, paranoia, or hallucinations.  Denies dizziness, syncope, seizures, numbness, tingling, tremor, tics, unsteady gait, slurred speech, confusion. Denies muscle or joint pain, stiffness, or dystonia. Denies unexplained weight loss, frequent infections, or sores that heal slowly.  No polyphagia, polydipsia, or polyuria. Denies visual changes or paresthesias.   Individual Medical History/ Review of Systems: Changes? :No     Past medications for mental health diagnoses include: Latuda, Depakote, Lexapro, Lamictal, Ambien, Risperdal caused nightmares,  Xanax, Tegretol was  effective.  Seroquel caused severe sedation, Zoloft, gabapentin at higher doses caused blurred vision and dizziness. Modafinil caused dizziness and headaches. Trileptal caused nausea and severe h/a. Paxil was "awful.'"  Allergies: Trileptal [oxcarbazepine], Lamictal [lamotrigine], and Modafinil  Current Medications:  Current Outpatient Medications:    ALPRAZolam (XANAX) 0.5 MG tablet, Take 1 tablet (0.5 mg total) by mouth 2 (two) times daily as needed. for anxiety, Disp: 180 tablet, Rfl: 1   baclofen (LIORESAL) 10 MG tablet, Take 10 mg by mouth 3 (three) times daily as needed., Disp: , Rfl:    Cholecalciferol (VITAMIN D3) 125 MCG (5000 UT) CAPS, Take 1 tablet by mouth daily., Disp: , Rfl:    gabapentin (NEURONTIN) 400 MG capsule, TAKE 1 CAPSULE BY MOUTH TWICE  DAILY, Disp: 180 capsule, Rfl: 3   Galcanezumab-gnlm (EMGALITY) 120 MG/ML SOAJ, Inject 1 Pen into the skin every 30 (thirty) days., Disp: 1 mL, Rfl: 7   Galcanezumab-gnlm (EMGALITY) 120 MG/ML SOSY, INJECT 1 ML SUBCUTANEOUSLY  ONCE EVERY MONTH, Disp: 1 mL, Rfl: 4   lisinopril (PRINIVIL,ZESTRIL) 5 MG tablet, , Disp: , Rfl:    meloxicam (MOBIC) 15 MG tablet, Take by mouth., Disp: , Rfl:    metFORMIN (GLUCOPHAGE) 500 MG tablet, Take 500 mg by mouth 2 (two) times daily with a meal. Metformin ER- 2 in the am, 2 in the pm, Disp: , Rfl:    metoprolol tartrate (LOPRESSOR) 50 MG tablet, 1 tablet in the am, 1/2 tablet in pm, Disp: , Rfl:    Multiple Vitamin (MULTIVITAMIN) tablet, Take 1 tablet by mouth daily., Disp: , Rfl:    OLANZapine-Samidorphan (LYBALVI) 5-10 MG TABS, Take 5-10 mg by mouth at bedtime., Disp: 90 tablet, Rfl: 1  Omega-3 Fatty Acids (OMEGA 3 500 PO), Take 500 mg by mouth., Disp: , Rfl:    rizatriptan (MAXALT) 10 MG tablet, TAKE 1 TABLET BY MOUTH AS NEEDED FOR MIGRAINE, MAY REPEAT IN 2 HOURS IF NEEDED, Disp: 10 tablet, Rfl: 0 Medication Side Effects: none      Family Medical/ Social History: Changes?  No  MENTAL HEALTH  EXAM:  There were no vitals taken for this visit.There is no height or weight on file to calculate BMI.  General Appearance: Casual, Neat, Well Groomed and Obese  Eye Contact:  Good  Speech:  Clear and Coherent  Volume:  Normal  Mood:  Euthymic  Affect:  Congruent  Thought Process:  Goal Directed and Descriptions of Associations: Circumstantial  Orientation:  Full (Time, Place, and Person)  Thought Content: Logical   Suicidal Thoughts:  No  Homicidal Thoughts:  No  Memory:  WNL  Judgement:  Good  Insight:  Good  Psychomotor Activity:  Normal  Concentration:  Concentration: Good and Attention Span: Good  Recall:  Good  Fund of Knowledge: Good  Language: Good  Assets:  Desire for Improvement Financial Resources/Insurance Housing Resilience Transportation Vocational/Educational  ADL's:  Intact  Cognition: WNL  Prognosis:  Good   DIAGNOSES:    ICD-10-CM   1. Mixed bipolar I disorder (HCC)  F31.60     2. Generalized anxiety disorder  F41.1     3. Work stress  Z56.6      Receiving Psychotherapy: No   RECOMMENDATIONS:  PDMP reviewed.  Gabapentin filled 10/09/2022.  Xanax filled 05/24/2022. I provided 20 minutes of face to face time during this encounter, including time spent before and after the visit in records review, medical decision making, counseling pertinent to today's visit, and charting.   He is doing very well so will continue the Lybalvi.  It is fine to wean off the Tegretol 200 mg, by 1/2 pill in the morning and 1 pill at night for 1 week.  Then take 1/2 pill twice a day for 1 week, then 1/2 pill once a day for 1 week and then stop it.  Otherwise no change.  Continue Xanax 0.5 mg, 1/2-1 twice daily as needed. Continue gabapentin 400 mg, 1 p.o. twice daily. Continue Emgality monthly per neurology. Continue Lybalvi 5/10 mg qhs.   Continue Maxalt as needed per neurology. Will see Neuro in a few months. Return in 6 weeks.  Melony Overly, PA-C

## 2022-11-14 ENCOUNTER — Other Ambulatory Visit: Payer: Self-pay | Admitting: Physician Assistant

## 2022-11-20 NOTE — Telephone Encounter (Signed)
Lybalvi 5-10 mg approved through 09/30/2023 PA # [id number] with [facility]

## 2022-12-08 ENCOUNTER — Encounter: Payer: Self-pay | Admitting: Neurology

## 2022-12-08 ENCOUNTER — Ambulatory Visit (INDEPENDENT_AMBULATORY_CARE_PROVIDER_SITE_OTHER): Payer: BC Managed Care – PPO | Admitting: Neurology

## 2022-12-08 VITALS — BP 106/78 | HR 84 | Ht 70.0 in | Wt 256.0 lb

## 2022-12-08 DIAGNOSIS — R42 Dizziness and giddiness: Secondary | ICD-10-CM

## 2022-12-08 MED ORDER — EMGALITY 120 MG/ML ~~LOC~~ SOAJ: 1.0000 | SUBCUTANEOUS | 7 refills | Status: DC

## 2022-12-08 MED ORDER — NURTEC 75 MG PO TBDP: 75.0000 mg | ORAL_TABLET | Freq: Two times a day (BID) | ORAL | 5 refills | Status: DC | PRN

## 2022-12-08 MED ORDER — EMGALITY 120 MG/ML ~~LOC~~ SOSY: 1.0000 mL | PREFILLED_SYRINGE | SUBCUTANEOUS | 9 refills | Status: DC

## 2022-12-08 MED ORDER — CETIRIZINE HCL 10 MG PO TABS: 10.0000 mg | ORAL_TABLET | Freq: Every day | ORAL | 1 refills | Status: DC

## 2022-12-08 NOTE — Progress Notes (Addendum)
Provider:  Melvyn Novas, MD  Primary Care Physician:  Joya [name] [address] [facility] [place] [address]     Referring Provider: Joya [name] [address] [place],  [place] [address]          Chief Complaint according to patient   Patient presents with:     New Patient (Initial Visit)     New problem, Dr Delena Bali patient - Reports Dizziness, lightheadedness.       HISTORY OF PRESENT ILLNESS:  Gregory Howe is a 57 y.o. male patient who is here for an advanced REVISIT with Dr Delena Bali, who is still on maternity leave, No new issues, needs refills,  therapy plan is clearly  lines -out  and patient could see NP until Dr Delena Bali returns.  12/08/2022 for  Headaches and dizziness:  Gregory Howe.   Chief concern according to patient : The patient reports today that he has some lightheadedness but also a sensation of vertigo when he rises from a lateral sleep position and tries to sit up.  He seems to mainly sleep on his left side counterclockwise rotation is the worst possible sensation he presents with.  Sometimes it is just a feeling of lightheadedness and not so much being in motion.  There is no associated nausea.  His headaches which were migrainous in origin have really improved by frequency and intensity under the current regimen but he does report that since spring he has a pressure sensation in the forehead above both eyebrows and this may be an allergy related sinus component. He also reports that his headaches and vertigo sensation get worse when he bends down for example to tie his shoes.  All these movements seem to initiate a sensation of more headache more pressure and a slight light headedness.  I am fairly convinced that this is a manifestation of sinusitis at this point.  I reviewed his medications which includes Glucophage, Lopressor, Maxalt, Emgality, Neurontin twice daily for 100 mg he is on vitamin D3 5000 units by mouth daily which is high he  is on baclofen up to 3 times daily as needed for back pain-  and Xanax if he has trouble falling asleep. He is on CPAP and actively followed by pulmonologist in [place], [place]       Follow-up Visit with Dr Delena Bali:    Last visit: 09/03/21   Brief HPI: 57 year old male with a history of anxiety, Bipolar disorder, DM2, HTN who follows in clinic for migraines.   At his last visit MRI brain was ordered. Emgality was restarted for prevention and Maxalt was started for rescue.   Interval History: His migraines have improved since restarting Emgality. Has not had any severe migraines in the past month. Picked up Maxalt but has not had to use it. Will sometimes get headaches if he looks at computer screens for too long, but these typically improve if he lies down.   Brain MRI was normal other than evidence of ethmoid sinus disease.     Current Headache Regimen: Preventative: Emgality 120 mg monthly Abortive: Maxalt 10 mg PRN   Prior Therapies                                  Lamictal Gabapentin 400 mg BID Tegretol 100 mg BID Lisinopril 5 mg daily Cymbalta  Topamax Metoprolol 50/25 Ajovy -  lack of efficacy Emgality Aimovig - nausea Botox Maxalt 10 mg PRN   Physical Exam:    Vital Signs: BP 121/85   Pulse 76   Ht 5\' 10"  (1.778 m)   Wt 254 lb 2 oz (115.3 kg)   BMI 36.46 kg/m  GENERAL:  well appearing, in no acute distress, alert  SKIN:  Color, texture, turgor normal. No rashes or lesions HEAD:  Normocephalic/atraumatic. RESP: normal respiratory effort MSK:  No gross joint deformities.    NEUROLOGICAL: Mental Status: Alert, oriented to person, place and time, Follows commands, and Speech fluent and appropriate. Cranial Nerves: PERRL, face symmetric, no dysarthria, hearing grossly intact Motor: moves all extremities equally Gait: normal-based.   IMPRESSION: 57 year old male with a history of anxiety, Bipolar disorder, DM2, HTN who presents for follow up of migraines. MRI  brain was normal. His migraines have improved with Emgality and he has not needed to use Maxalt for rescue. Will continue current regimen for now.   PLAN: -Preventive: Continue Emgality 120 mg monthly -Rescue: Continue Maxalt 10 mg PRN  Follow-up: 1 year or sooner if needed   I spent a total of 13 minutes on the date of the service. Discussed medication side effects, adverse reactions and drug interactions. Written educational materials and patient instructions outlining all of the above were given.   Ocie Doyne, MD 01/30/22 1:48 PM      Review of Systems: Out of a complete 14 system review, the patient complains of only the following symptoms, and all other reviewed systems are negative.:  Has been on gabapentin for Bipolar.   Vertigo   No longer sleepy since being on emgality;  used to take 3  40 minutes naps daily.   Social History   Socioeconomic History   Marital status: Married    Spouse name: Stanton Kidney   Number of children: Not on file   Years of education: Not on file   Highest education level: Associate degree: academic program  Occupational History   Occupation: Occupational hygienist: [employer]  Tobacco Use   Smoking status: Never   Smokeless tobacco: Never  Substance and Sexual Activity   Alcohol use: Not Currently   Drug use: Not Currently   Sexual activity: Not on file  Other Topics Concern   Not on file  Social History Narrative   Lives with wife   Caffeine- soda 1 a day, tea 2 glasses, Monster 1 a day   Social Determinants of Corporate investment banker Strain: Not on file  Food Insecurity: Not on file  Transportation Needs: Not on file  Physical Activity: Not on file  Stress: Not on file  Social Connections: Not on file    Family History  Problem Relation Age of Onset   Depression Mother    Hypertension Mother    Anxiety disorder Mother    Cancer Father     Past Medical History:  Diagnosis Date   Ankle pain    Anxiety    Back pain     Bipolar 1 disorder (HCC)    Depression    Diabetes (HCC)    Gallbladder problem    Hypertension    Kidney disease    Knee pain    Migraine    Overweight    Sleep apnea     Past Surgical History:  Procedure Laterality Date   CARPAL TUNNEL RELEASE Bilateral    CHOLECYSTECTOMY       Current Outpatient Medications on File  Prior to Visit  Medication Sig Dispense Refill   ALPRAZolam (XANAX) 0.5 MG tablet Take 1 tablet (0.5 mg total) by mouth 2 (two) times daily as needed. for anxiety 180 tablet 1   baclofen (LIORESAL) 10 MG tablet Take 10 mg by mouth 3 (three) times daily as needed.     Cholecalciferol (VITAMIN D3) 125 MCG (5000 UT) CAPS Take 1 tablet by mouth daily.     gabapentin (NEURONTIN) 400 MG capsule TAKE 1 CAPSULE BY MOUTH TWICE  DAILY 180 capsule 3   Galcanezumab-gnlm (EMGALITY) 120 MG/ML SOAJ Inject 1 Pen into the skin every 30 (thirty) days. 1 mL 7   lisinopril (PRINIVIL,ZESTRIL) 5 MG tablet      meloxicam (MOBIC) 15 MG tablet Take by mouth.     metFORMIN (GLUCOPHAGE) 500 MG tablet Take 500 mg by mouth 2 (two) times daily with a meal. Metformin ER- 2 in the am, 2 in the pm     metoprolol tartrate (LOPRESSOR) 50 MG tablet 1 tablet in the am, 1/2 tablet in pm     Multiple Vitamin (MULTIVITAMIN) tablet Take 1 tablet by mouth daily.     Omega-3 Fatty Acids (OMEGA 3 500 PO) Take 500 mg by mouth.     rizatriptan (MAXALT) 10 MG tablet TAKE 1 TABLET BY MOUTH AS NEEDED FOR MIGRAINE, MAY REPEAT IN 2 HOURS IF NEEDED (Patient not taking: Reported on 12/08/2022) 10 tablet 0   No current facility-administered medications on file prior to visit.    Allergies  Allergen Reactions   Trileptal [Oxcarbazepine] Nausea And Vomiting   Lamictal [Lamotrigine] Other (See Comments)    headaches   Modafinil Other (See Comments)    headache     DIAGNOSTIC DATA (LABS, IMAGING, TESTING) - I reviewed patient records, labs, notes, testing and imaging myself where available.  Lab Results   Component Value Date   WBC 5.6 02/27/2022   HGB 15.8 02/27/2022   HCT 44.1 02/27/2022   MCV 91 02/27/2022   PLT 185 02/27/2022      Component Value Date/Time   NA 138 02/27/2022 0845   K 4.5 02/27/2022 0845   CL 98 02/27/2022 0845   CO2 23 02/27/2022 0845   GLUCOSE 133 (H) 02/27/2022 0845   BUN 16 02/27/2022 0845   CREATININE 1.06 02/27/2022 0845   CALCIUM 10.0 02/27/2022 0845   PROT 6.8 02/27/2022 0845   ALBUMIN 4.5 02/27/2022 0845   AST 20 02/27/2022 0845   ALT 28 02/27/2022 0845   ALKPHOS 62 02/27/2022 0845   BILITOT 0.2 02/27/2022 0845   GFRNONAA 72 05/30/2020 0814   GFRAA 84 05/30/2020 0814   Lab Results  Component Value Date   CHOL 155 04/10/2021   HDL 37 (L) 04/10/2021   LDLCALC 90 04/10/2021   TRIG 163 (H) 04/10/2021   Lab Results  Component Value Date   HGBA1C 6.0 (H) 04/10/2021   Lab Results  Component Value Date   VITAMINB12 305 04/10/2021   Lab Results  Component Value Date   TSH 2.020 04/10/2021    PHYSICAL EXAM:  Today's Vitals   12/08/22 0924 12/08/22 0927 12/08/22 0929  BP: 98/64 122/78 106/78  Pulse: 72 73 84  Weight: 256 lb (116.1 kg)    Height: 5\' 10"  (1.778 m)     Body mass index is 36.73 kg/m.   Wt Readings from Last 3 Encounters:  12/08/22 256 lb (116.1 kg)  01/30/22 254 lb 2 oz (115.3 kg)  09/03/21 259 lb 12.8 oz (117.8 kg)  Ht Readings from Last 3 Encounters:  12/08/22 5\' 10"  (1.778 m)  01/30/22 5\' 10"  (1.778 m)  09/03/21 5\' 10"  (1.778 m)      General: The patient is awake, alert and appears not in acute distress. The patient is well groomed. Head: Normocephalic, atraumatic. Neck is supple. Mallampati 3 plus ,  neck circumference:19 inches . Nasal airflow barely  patent.   Retrognathia is  seen.  Dental status: biological  Cardiovascular:  Regular rate and cardiac rhythm by pulse,  without distended neck veins. Respiratory: Lungs are clear to auscultation.  Skin:  Without evidence of ankle edema, or  rash. Trunk: The patient's posture is erect.   NEUROLOGIC EXAM: The patient is awake and alert, oriented to place and time.   Memory subjective described as intact.  Attention span & concentration ability appears normal.  Speech is fluent,  without  dysarthria, dysphonia or aphasia.  Mood and affect are appropriate.   Cranial nerves: no loss of smell or taste reported  Pupils are equal and briskly reactive to light. Funduscopic exam defe.  Extraocular movements in vertical and horizontal planes were intact and without nystagmus. No Diplopia. Visual fields by finger perimetry are intact. Hearing was intact to soft voice and finger rubbing.    Facial sensation intact to fine touch.  Facial motor strength is symmetric and tongue and uvula move midline.  Neck ROM : rotation, tilt and flexion extension were normal for age and shoulder shrug was symmetrical.    Motor exam:  Symmetric bulk, tone and ROM.   Normal tone without cog- wheeling, symmetric grip strength .   Sensory:  Fine touch,and vibration were normal.   Coordination: Rapid alternating movements in the fingers/hands were of normal speed.  The Finger-to-nose maneuver was intact without evidence of ataxia, dysmetria or tremor.   Gait and station: Patient could rise unassisted from a seated position, walked without assistive device. His pain sometimes leads him him to lean to the left.  Deep tendon reflexes: in the  upper and lower extremities are symmetric and intact.  Babinski response was deferred .    ASSESSMENT AND PLAN 57 y.o.  male  here to see me as WID with:    1) refill needs for established medication   2) start ZYRTEC OTC daily , can be taken at bedtime po.   3) revisit with Dr Delena Bali ASAP/    Follow up either personally with primary neurologist  within 2 months.   I would like to thank Joya [name] and Joya [name] [address] [place],  [place] [address] for allowing me to meet with and to take  care of this pleasant patient.   After spending a total time of  30  minutes face to face and additional time for physical and neurologic examination, review of laboratory studies,  personal review of imaging studies, reports and results of other testing and review of referral information / records as far as provided in visit,   Electronically signed by: Melvyn Novas, MD 12/08/2022 9:40 AM  [facility] Board certified by The [employer] of Sleep Medicine and Diplomate of the [employer] of Sleep Medicine. Board certified In Neurology through the ABPN, Fellow of the [employer] of Neurology.

## 2022-12-08 NOTE — Patient Instructions (Addendum)
Please keep the appointment with Dr Delena Bali in September, 02-05-2023.

## 2022-12-09 ENCOUNTER — Ambulatory Visit (INDEPENDENT_AMBULATORY_CARE_PROVIDER_SITE_OTHER): Payer: BC Managed Care – PPO | Admitting: Physician Assistant

## 2022-12-09 ENCOUNTER — Encounter: Payer: Self-pay | Admitting: Physician Assistant

## 2022-12-09 ENCOUNTER — Other Ambulatory Visit: Payer: Self-pay | Admitting: Neurology

## 2022-12-09 DIAGNOSIS — Z566 Other physical and mental strain related to work: Secondary | ICD-10-CM | POA: Diagnosis not present

## 2022-12-09 DIAGNOSIS — F3160 Bipolar disorder, current episode mixed, unspecified: Secondary | ICD-10-CM | POA: Diagnosis not present

## 2022-12-09 DIAGNOSIS — F411 Generalized anxiety disorder: Secondary | ICD-10-CM

## 2022-12-09 DIAGNOSIS — R454 Irritability and anger: Secondary | ICD-10-CM

## 2022-12-09 DIAGNOSIS — R0789 Other chest pain: Secondary | ICD-10-CM

## 2022-12-09 MED ORDER — OLANZAPINE 5 MG PO TABS: ORAL_TABLET | ORAL | 1 refills | Status: DC

## 2022-12-09 NOTE — Progress Notes (Signed)
[facility]  Patient ID: Gregory Howe,  MRN: [id number]  PCP: Joya [name]  Date of Evaluation: 12/09/2022 Time spent: 33 minutes  Chief Complaint:  Chief Complaint   Follow-up    HISTORY/CURRENT STATUS: Not doing well.  Accompanied by his wife.  For a few weeks, sitting in dark, not wanting to do anything.  Energy and motivation have been low.  ADLs and personal hygiene are normal however.  Appetite is normal, weight is stable.  Work is still extremely stressful but it is going okay.  Denies suicidal or homicidal thoughts.  Went off Lybalvi approximately 2 wks ago. made him feel horrible. He took it for about 2 months. Caused chest pain, described as sharp and stabbing, and once he went off the Lybalvi the chest pain stopped.  It did not radiate, he had no nausea or vomiting associated with it.  He restarted CBZ, has been on it for a week. Some better as far as his mood goes.  He is not having as much depression, anxiety, or manic behaviors. Patient denies increased energy with decreased need for sleep, increased talkativeness, racing thoughts, impulsivity or risky behaviors, increased spending, increased libido, grandiosity, increased irritability or anger, paranoia, or hallucinations.  Review of Systems  Constitutional: Negative.   HENT: Negative.    Eyes: Negative.   Respiratory: Negative.    Cardiovascular: Negative.   Gastrointestinal: Negative.   Genitourinary: Negative.   Musculoskeletal: Negative.   Skin: Negative.   Neurological: Negative.   Endo/Heme/Allergies: Negative.   Psychiatric/Behavioral:         See HPI   Individual Medical History/ Review of Systems: Changes? :Yes see above.  He saw neurology since our last visit.  Past medications for mental health diagnoses include: Latuda, Depakote, Lexapro, Lamictal, Ambien, Risperdal caused nightmares,  Xanax, Tegretol was effective.  Seroquel caused severe sedation, Zoloft, gabapentin at higher doses  caused blurred vision and dizziness. Modafinil caused dizziness and headaches. Trileptal caused nausea and severe h/a. Paxil was "awful.'"  Allergies: Trileptal [oxcarbazepine], Lamictal [lamotrigine], and Modafinil  Current Medications:  Current Outpatient Medications:    ALPRAZolam (XANAX) 0.5 MG tablet, Take 1 tablet (0.5 mg total) by mouth 2 (two) times daily as needed. for anxiety, Disp: 180 tablet, Rfl: 1   baclofen (LIORESAL) 10 MG tablet, Take 10 mg by mouth 3 (three) times daily as needed., Disp: , Rfl:    carbamazepine (TEGRETOL XR) 100 MG 12 hr tablet, Take 100 mg by mouth 2 (two) times daily., Disp: , Rfl:    cetirizine (ZYRTEC ALLERGY) 10 MG tablet, Take 1 tablet (10 mg total) by mouth at bedtime., Disp: 30 tablet, Rfl: 1   Cholecalciferol (VITAMIN D3) 125 MCG (5000 UT) CAPS, Take 1 tablet by mouth daily., Disp: , Rfl:    EMGALITY 120 MG/ML SOSY, Inject 1 mL into the skin every 30 (thirty) days., Disp: 1 mL, Rfl: 9   gabapentin (NEURONTIN) 400 MG capsule, TAKE 1 CAPSULE BY MOUTH TWICE  DAILY, Disp: 180 capsule, Rfl: 3   Galcanezumab-gnlm (EMGALITY) 120 MG/ML SOAJ, Inject 1 Pen into the skin every 30 (thirty) days., Disp: 1 mL, Rfl: 7   lisinopril (PRINIVIL,ZESTRIL) 5 MG tablet, , Disp: , Rfl:    meloxicam (MOBIC) 15 MG tablet, Take by mouth., Disp: , Rfl:    metFORMIN (GLUCOPHAGE) 500 MG tablet, Take 500 mg by mouth 2 (two) times daily with a meal. Metformin ER- 2 in the am, 2 in the pm, Disp: , Rfl:    metoprolol  tartrate (LOPRESSOR) 50 MG tablet, 1 tablet in the am, 1/2 tablet in pm, Disp: , Rfl:    Multiple Vitamin (MULTIVITAMIN) tablet, Take 1 tablet by mouth daily., Disp: , Rfl:    OLANZapine (ZYPREXA) 5 MG tablet, 1/2 po at bedtime for 3 nights, then increase to 1 po at bedtime., Disp: 30 tablet, Rfl: 1   Omega-3 Fatty Acids (OMEGA 3 500 PO), Take 500 mg by mouth., Disp: , Rfl:    Rimegepant Sulfate (NURTEC) 75 MG TBDP, Take 1 tablet (75 mg total) by mouth daily as needed  (Take 1 tablet at onset of migraine(do NOT exceed more than 1 tablet in 24 hrs))., Disp: 8 tablet, Rfl: 5 Medication Side Effects:  chest pain, with Lybalvi        Family Medical/ Social History: Changes?  No  MENTAL HEALTH EXAM:  There were no vitals taken for this visit.There is no height or weight on file to calculate BMI.  General Appearance: Casual, Neat, Well Groomed and Obese  Eye Contact:  Good  Speech:  Clear and Coherent  Volume:  Normal  Mood:   sad  Affect:  Congruent  Thought Process:  Goal Directed and Descriptions of Associations: Circumstantial  Orientation:  Full (Time, Place, and Person)  Thought Content: Logical   Suicidal Thoughts:  No  Homicidal Thoughts:  No  Memory:  WNL  Judgement:  Good  Insight:  Good  Psychomotor Activity:  Normal  Concentration:  Concentration: Good and Attention Span: Good  Recall:  Good  Fund of Knowledge: Good  Language: Good  Assets:  Desire for Improvement Financial Resources/Insurance Housing Resilience Transportation Vocational/Educational  ADL's:  Intact  Cognition: WNL  Prognosis:  Good   DIAGNOSES:    ICD-10-CM   1. Mixed bipolar I disorder (HCC)  F31.60     2. Generalized anxiety disorder  F41.1     3. Work stress  Z56.6     4. Irritability and anger  R45.4     5. Other chest pain  R07.89      Receiving Psychotherapy: No   RECOMMENDATIONS:  PDMP reviewed.  Gabapentin filled 10/09/2022.  Xanax filled 05/21/2022. I provided 33 minutes of face to face time during this encounter, including time spent before and after the visit in records review, medical decision making, counseling pertinent to today's visit, and charting.   We had a long discussion about treatment options.  When he started Lybalvi several months ago he responded very well as far as his mood went.  I explained that Lybalvi is olanzapine plus some endorphin which is supposed to decrease appetite and prevent weight gain from the olanzapine.  He  asks if he could try the olanzapine alone, because he did feel better mentally.  He understands that the chest pain may or may not have been related to the medication and if it recurs he should go to the ER.  It is fine to try the olanzapine alone, again he needs to seek medical treatment urgently if the chest pain recurs.  He knows to stop the olanzapine at that time if it does occur, and then call me.  Continue Xanax 0.5 mg, 1/2-1 twice daily as needed. Continue Tegretol XR 100 mg, 1 p.o. twice daily. Continue gabapentin 400 mg, 1 p.o. twice daily. Continue Emgality monthly per neurology. Continue gabapentin 400 mg, 1 p.o. twice daily. Start Zyprexa 5 mg, 1/2 pill p.o. nightly for 3 nights and then 1 p.o. nightly. Continue Nurtec per neurology. Healthy lifestyle  and diet were recommended. Recommend counseling. Return in 4 weeks.  Melony Overly, PA-C

## 2022-12-10 ENCOUNTER — Other Ambulatory Visit: Payer: Self-pay | Admitting: Neurology

## 2022-12-10 MED ORDER — NURTEC 75 MG PO TBDP: 75.0000 mg | ORAL_TABLET | Freq: Every day | ORAL | 5 refills | Status: DC | PRN

## 2022-12-12 ENCOUNTER — Ambulatory Visit: Payer: BC Managed Care – PPO | Admitting: Physician Assistant

## 2023-01-15 ENCOUNTER — Encounter: Payer: Self-pay | Admitting: Physician Assistant

## 2023-01-15 ENCOUNTER — Ambulatory Visit: Payer: BC Managed Care – PPO | Admitting: Physician Assistant

## 2023-01-15 DIAGNOSIS — F411 Generalized anxiety disorder: Secondary | ICD-10-CM | POA: Diagnosis not present

## 2023-01-15 DIAGNOSIS — F3160 Bipolar disorder, current episode mixed, unspecified: Secondary | ICD-10-CM

## 2023-01-15 DIAGNOSIS — G4701 Insomnia due to medical condition: Secondary | ICD-10-CM

## 2023-01-15 NOTE — Progress Notes (Signed)
[facility]  Patient ID: Gregory Howe,  MRN: [id number]  PCP: Joya [name]  Date of Evaluation: 01/15/2023 Time spent:20 minutes  Chief Complaint:  Chief Complaint   Anxiety; Depression; Follow-up    HISTORY/CURRENT STATUS: For routine med check.   Wasn't able to tolerate the Zyprexa so he stopped it.  It made him feel "bad" but he does not elaborate.  He feels that he should just stay on the current medications and try his best to handle the depression.  He states it is mostly caused by working at [employer], he has been extremely unhappy there for years.  States it is a toxic environment.  They do not have a good safety record, and several years ago there were a couple of deaths.  He is going to be looking into early retirement or something.  Working there because of his migraines, generalized anxiety, depression, panic attacks, situational anxiety, and he is ready to get out.  He is not sure if he can financially but he is looking into it.  Xanax helps when needed.  Energy and motivation are fair to good depending on the situation.  When he gets home from work he is too tired and stressed to want to do anything fun.  On his days off he has to recover but by the time he does, it is time to start all over.  No extreme sadness, tearfulness, or feelings of hopelessness.  Sleeps okay.  ADLs and personal hygiene are normal.   Denies any changes in concentration, making decisions, or remembering things.  Appetite has not changed.  Weight is stable.  Denies suicidal or homicidal thoughts.  Patient denies increased energy with decreased need for sleep, increased talkativeness, racing thoughts, impulsivity or risky behaviors, increased spending, increased libido, grandiosity, increased irritability or anger, paranoia, or hallucinations.  Denies dizziness, syncope, seizures, numbness, tingling, tremor, tics, unsteady gait, slurred speech, confusion. Denies muscle or joint pain,  stiffness, or dystonia.  Individual Medical History/ Review of Systems: Changes? :No    Past medications for mental health diagnoses include: Latuda, Depakote, Lexapro, Lamictal, Ambien, Risperdal caused nightmares,  Xanax, Tegretol was effective.  Seroquel caused severe sedation, Zoloft, gabapentin at higher doses caused blurred vision and dizziness. Modafinil caused dizziness and headaches. Trileptal caused nausea and severe h/a. Paxil was "awful.'"  Zyprexa  Allergies: Trileptal [oxcarbazepine], Lamictal [lamotrigine], and Modafinil  Current Medications:  Current Outpatient Medications:    ALPRAZolam (XANAX) 0.5 MG tablet, Take 1 tablet (0.5 mg total) by mouth 2 (two) times daily as needed. for anxiety, Disp: 180 tablet, Rfl: 1   baclofen (LIORESAL) 10 MG tablet, Take 10 mg by mouth 3 (three) times daily as needed., Disp: , Rfl:    carbamazepine (TEGRETOL XR) 100 MG 12 hr tablet, Take 100 mg by mouth 2 (two) times daily., Disp: , Rfl:    Cholecalciferol (VITAMIN D3) 125 MCG (5000 UT) CAPS, Take 1 tablet by mouth daily., Disp: , Rfl:    EMGALITY 120 MG/ML SOSY, Inject 1 mL into the skin every 30 (thirty) days., Disp: 1 mL, Rfl: 9   gabapentin (NEURONTIN) 400 MG capsule, TAKE 1 CAPSULE BY MOUTH TWICE  DAILY, Disp: 180 capsule, Rfl: 3   Galcanezumab-gnlm (EMGALITY) 120 MG/ML SOAJ, Inject 1 Pen into the skin every 30 (thirty) days., Disp: 1 mL, Rfl: 7   lisinopril (PRINIVIL,ZESTRIL) 5 MG tablet, , Disp: , Rfl:    meloxicam (MOBIC) 15 MG tablet, Take by mouth., Disp: , Rfl:    metFORMIN (  GLUCOPHAGE) 500 MG tablet, Take 500 mg by mouth 2 (two) times daily with a meal. Metformin ER- 2 in the am, 2 in the pm, Disp: , Rfl:    metoprolol tartrate (LOPRESSOR) 50 MG tablet, 1 tablet in the am, 1/2 tablet in pm, Disp: , Rfl:    Multiple Vitamin (MULTIVITAMIN) tablet, Take 1 tablet by mouth daily., Disp: , Rfl:    Omega-3 Fatty Acids (OMEGA 3 500 PO), Take 500 mg by mouth., Disp: , Rfl:    Rimegepant  Sulfate (NURTEC) 75 MG TBDP, Take 1 tablet (75 mg total) by mouth daily as needed (Take 1 tablet at onset of migraine(do NOT exceed more than 1 tablet in 24 hrs))., Disp: 8 tablet, Rfl: 5   cetirizine (ZYRTEC ALLERGY) 10 MG tablet, Take 1 tablet (10 mg total) by mouth at bedtime. (Patient not taking: Reported on 01/15/2023), Disp: 30 tablet, Rfl: 1 Medication Side Effects:  chest pain, with Lybalvi        Family Medical/ Social History: Changes?  No  MENTAL HEALTH EXAM:  There were no vitals taken for this visit.There is no height or weight on file to calculate BMI.  General Appearance: Casual, Neat, Well Groomed and Obese  Eye Contact:  Good  Speech:  Clear and Coherent  Volume:  Normal  Mood:  Euthymic  Affect:  Congruent  Thought Process:  Goal Directed and Descriptions of Associations: Circumstantial  Orientation:  Full (Time, Place, and Person)  Thought Content: Logical   Suicidal Thoughts:  No  Homicidal Thoughts:  No  Memory:  WNL  Judgement:  Good  Insight:  Good  Psychomotor Activity:  Normal  Concentration:  Concentration: Good and Attention Span: Good  Recall:  Good  Fund of Knowledge: Good  Language: Good  Assets:  Desire for Improvement Financial Resources/Insurance Housing Resilience Transportation Vocational/Educational  ADL's:  Intact  Cognition: WNL  Prognosis:  Good   DIAGNOSES:    ICD-10-CM   1. Generalized anxiety disorder  F41.1     2. Insomnia due to medical condition  G47.01     3. Mixed bipolar I disorder (HCC)  F31.60      Receiving Psychotherapy: No   RECOMMENDATIONS:  PDMP reviewed.  Gabapentin filled 12/29/2022  Xanax filled 05/21/2022. I provided 20  minutes of face to face time during this encounter, including time spent before and after the visit in records review, medical decision making, counseling pertinent to today's visit, and charting.   We discussed his situation.  I hope he is able to leave [employer] as soon as possible.  The  stress is causing his mental illness to be worse, and physical symptoms as well.  He understands.  Continue Xanax 0.5 mg, 1/2-1 twice daily as needed. Continue Tegretol XR 100 mg, 1 p.o. twice daily. Continue gabapentin 400 mg, 1 p.o. twice daily. Continue Emgality monthly per neurology. Continue gabapentin 400 mg, 1 p.o. twice daily. Continue Nurtec per neurology. Healthy lifestyle and diet were recommended. Recommend counseling. Return in 2 months.   Melony Overly, PA-C

## 2023-01-27 ENCOUNTER — Other Ambulatory Visit: Payer: Self-pay | Admitting: Psychiatry

## 2023-01-28 NOTE — Telephone Encounter (Signed)
Last seen on 01/30/22 Follow up scheduled on 02/12/23

## 2023-02-05 ENCOUNTER — Ambulatory Visit: Payer: BC Managed Care – PPO | Admitting: Psychiatry

## 2023-02-09 ENCOUNTER — Other Ambulatory Visit (HOSPITAL_COMMUNITY): Payer: Self-pay

## 2023-02-09 ENCOUNTER — Telehealth: Payer: Self-pay

## 2023-02-09 NOTE — Telephone Encounter (Signed)
*  [facility]  Pharmacy Patient Advocate Encounter   Received notification from CoverMyMeds that prior authorization for Emgality 120MG /ML auto-injectors (migraine)  is required/requested.   Insurance verification completed.   The patient is insured through [facility] .   Per test claim: PA required; PA submitted to [facility] via CoverMyMeds Key/confirmation #/EOC [id number] Status is pending

## 2023-02-12 ENCOUNTER — Ambulatory Visit (INDEPENDENT_AMBULATORY_CARE_PROVIDER_SITE_OTHER): Payer: BC Managed Care – PPO | Admitting: Psychiatry

## 2023-02-12 ENCOUNTER — Encounter: Payer: Self-pay | Admitting: Psychiatry

## 2023-02-12 VITALS — BP 118/76 | HR 86 | Ht 70.0 in | Wt 255.5 lb

## 2023-02-12 DIAGNOSIS — G43119 Migraine with aura, intractable, without status migrainosus: Secondary | ICD-10-CM

## 2023-02-12 MED ORDER — EMGALITY 120 MG/ML ~~LOC~~ SOAJ
120.0000 mg | SUBCUTANEOUS | 11 refills | Status: DC
Start: 2023-02-12 — End: 2023-12-16

## 2023-02-12 NOTE — Progress Notes (Signed)
   CC:  headaches  Follow-up Visit  Last visit: 12/08/22  Brief HPI: 57 year old male with a history of anxiety, Bipolar disorder, DM2, HTN who follows in clinic for migraines.  MRI brain 09/2021 was unremarkable.  At his last visit he was continued on Emgality for prevention and started on Nurtec for rescue.  Interval History: Headaches are well-controlled on Emgality. Has only taken Nurtec a couple of times since his last visit. It works well for rescue. His vertigo has resolved.   Migraine days per month: 0 Headache free days per month: 30  Current Headache Regimen: Preventative: Emgality 120 mg monthly Abortive: Nurtec 75 mg PRN   Prior Therapies                                  Preventive: Lamictal Gabapentin 400 mg BID Tegretol 100 mg BID Lisinopril 5 mg daily Cymbalta  Topamax Metoprolol 50/25 Ajovy - lack of efficacy Emgality - effective Aimovig - nausea Botox  Rescue: Maxalt 10 mg PRN Nurtec 75 mg PRN  Physical Exam:   Vital Signs: There were no vitals taken for this visit. GENERAL:  well appearing, in no acute distress, alert  SKIN:  Color, texture, turgor normal. No rashes or lesions HEAD:  Normocephalic/atraumatic. RESP: normal respiratory effort MSK:  No gross joint deformities.   NEUROLOGICAL: Mental Status: Alert, oriented to person, place and time, Follows commands, and Speech fluent and appropriate. Cranial Nerves: PERRL, face symmetric, no dysarthria, hearing grossly intact Motor: moves all extremities equally Gait: normal-based.  IMPRESSION: 57 year old male with a history of anxiety, Bipolar disorder, DM2, HTN who presents for follow up of migraines. His headaches are well-controlled on Emgality for prevention and Nurtec for rescue. Will continue current regimen for now.  PLAN: -Prevention: Continue Emgality 120 mg monthly (prefers auto-injector) -Rescue: Continue Nurtec 75 mg PRN for rescue   Follow-up: 1 year, or sooner if  needed  I spent a total of 20 minutes on the date of the service. Headache education was done. Discussed treatment options including preventive and acute medications. Discussed medication side effects, adverse reactions and drug interactions. Written educational materials and patient instructions outlining all of the above were given.  Ocie Doyne, MD 02/12/23 1:18 PM

## 2023-02-17 ENCOUNTER — Encounter: Payer: Self-pay | Admitting: Psychiatry

## 2023-02-17 NOTE — Telephone Encounter (Signed)
Looks like he is already taking Nurtec so I'm not sure why they denied it unless they want him to take it every other day for prevention. Can we try to appeal and say he is already on Nurtec?

## 2023-02-24 ENCOUNTER — Telehealth: Payer: Self-pay | Admitting: Psychiatry

## 2023-02-24 NOTE — Telephone Encounter (Signed)
[facility], Dr. Earlene Plater asking to speak with a nurse or Dr. Delena Bali to discuss when was the patient on Amovig? Refused to give full name or phone number.

## 2023-02-27 ENCOUNTER — Other Ambulatory Visit (HOSPITAL_COMMUNITY): Payer: Self-pay

## 2023-02-27 NOTE — Telephone Encounter (Signed)
Pharmacy Patient Advocate Encounter  Received notification from [facility] that Prior Authorization for Emgality 120MG /ML auto-injectors (migraine) has been APPROVED from 02/09/2023 to 08/24/2023   PA #/Case ID/Reference #: [id number]  Appeal Approval

## 2023-03-16 ENCOUNTER — Ambulatory Visit (INDEPENDENT_AMBULATORY_CARE_PROVIDER_SITE_OTHER): Payer: BC Managed Care – PPO | Admitting: Physician Assistant

## 2023-03-16 ENCOUNTER — Encounter: Payer: Self-pay | Admitting: Physician Assistant

## 2023-03-16 DIAGNOSIS — G4701 Insomnia due to medical condition: Secondary | ICD-10-CM | POA: Diagnosis not present

## 2023-03-16 DIAGNOSIS — Z566 Other physical and mental strain related to work: Secondary | ICD-10-CM

## 2023-03-16 DIAGNOSIS — G8929 Other chronic pain: Secondary | ICD-10-CM

## 2023-03-16 DIAGNOSIS — F319 Bipolar disorder, unspecified: Secondary | ICD-10-CM

## 2023-03-16 DIAGNOSIS — F411 Generalized anxiety disorder: Secondary | ICD-10-CM

## 2023-03-16 DIAGNOSIS — M545 Low back pain, unspecified: Secondary | ICD-10-CM

## 2023-03-16 DIAGNOSIS — G43109 Migraine with aura, not intractable, without status migrainosus: Secondary | ICD-10-CM | POA: Diagnosis not present

## 2023-03-16 MED ORDER — ALPRAZOLAM 0.5 MG PO TABS: 0.5000 mg | ORAL_TABLET | Freq: Two times a day (BID) | ORAL | 1 refills | Status: DC | PRN

## 2023-03-16 MED ORDER — CARBAMAZEPINE ER 100 MG PO TB12: 100.0000 mg | ORAL_TABLET | Freq: Two times a day (BID) | ORAL | 1 refills | Status: DC

## 2023-03-16 NOTE — Progress Notes (Unsigned)
[facility]  Patient ID: Gregory Howe,  MRN: [id number]  PCP: Joya [name]  Date of Evaluation: 03/16/2023 Time spent:20 minutes  Chief Complaint:  Chief Complaint   Anxiety; Depression; Insomnia; Follow-up    HISTORY/CURRENT STATUS: For routine med check.   He's doing fairly well with his mental health. Energy and motivation are good.  In a lot of pain with his back which makes it hard to do stuff.  Work is still as stressful as ever.  No extreme sadness, tearfulness, or feelings of hopelessness.  Sleeps well most of the time. ADLs and personal hygiene are normal.   Denies any changes in concentration, making decisions, or remembering things.  Appetite has not changed.  Weight is stable.  Denies laxative use, calorie restricting, or binging and purging.   Denies cutting or any form of self-harm.  Denies suicidal or homicidal thoughts.   He's in a lot of back pain, has been in pain for years. Sees surgeon tomorrow, he's had epidurals, PT and nothing helps long-term. Feels like he will need surgery at some point.           Denies dizziness, syncope, seizures, numbness, tingling, tremor, tics, unsteady gait, slurred speech, confusion. Denies muscle or joint pain, stiffness, or dystonia.  Individual Medical History/ Review of Systems: Changes? :No    Past medications for mental health diagnoses include: Latuda, Depakote, Lexapro, Lamictal, Ambien, Risperdal caused nightmares,  Xanax, Tegretol was effective.  Seroquel caused severe sedation, Zoloft, gabapentin at higher doses caused blurred vision and dizziness. Modafinil caused dizziness and headaches. Trileptal caused nausea and severe h/a. Paxil was "awful.'"  Zyprexa  Allergies: Trileptal [oxcarbazepine], Lamictal [lamotrigine], and Modafinil  Current Medications:  Current Outpatient Medications:    baclofen (LIORESAL) 10 MG tablet, Take 10 mg by mouth 3 (three) times daily as needed., Disp: , Rfl:     Cholecalciferol (VITAMIN D3) 125 MCG (5000 UT) CAPS, Take 1 tablet by mouth daily., Disp: , Rfl:    gabapentin (NEURONTIN) 400 MG capsule, TAKE 1 CAPSULE BY MOUTH TWICE  DAILY, Disp: 180 capsule, Rfl: 3   Galcanezumab-gnlm (EMGALITY) 120 MG/ML SOAJ, Inject 120 mg into the skin every 30 (thirty) days., Disp: 1.12 mL, Rfl: 11   lisinopril (PRINIVIL,ZESTRIL) 5 MG tablet, , Disp: , Rfl:    meloxicam (MOBIC) 15 MG tablet, Take by mouth., Disp: , Rfl:    metFORMIN (GLUCOPHAGE) 500 MG tablet, Take 500 mg by mouth 2 (two) times daily with a meal. Metformin ER- 2 in the am, 2 in the pm, Disp: , Rfl:    metoprolol tartrate (LOPRESSOR) 50 MG tablet, 1 tablet in the am, 1/2 tablet in pm, Disp: , Rfl:    Multiple Vitamin (MULTIVITAMIN) tablet, Take 1 tablet by mouth daily., Disp: , Rfl:    Omega-3 Fatty Acids (OMEGA 3 500 PO), Take 500 mg by mouth., Disp: , Rfl:    Rimegepant Sulfate (NURTEC) 75 MG TBDP, Take 1 tablet (75 mg total) by mouth daily as needed (Take 1 tablet at onset of migraine(do NOT exceed more than 1 tablet in 24 hrs))., Disp: 8 tablet, Rfl: 5   ALPRAZolam (XANAX) 0.5 MG tablet, Take 1 tablet (0.5 mg total) by mouth 2 (two) times daily as needed. for anxiety, Disp: 180 tablet, Rfl: 1   carbamazepine (TEGRETOL XR) 100 MG 12 hr tablet, Take 1 tablet (100 mg total) by mouth 2 (two) times daily., Disp: 180 tablet, Rfl: 1 Medication Side Effects:  chest pain, with Lybalvi  Family Medical/ Social History: Changes?  No  MENTAL HEALTH EXAM:  There were no vitals taken for this visit.There is no height or weight on file to calculate BMI.  General Appearance: Casual, Neat, Well Groomed and Obese  Eye Contact:  Good  Speech:  Clear and Coherent and Normal Rate  Volume:  Normal  Mood:  Euthymic  Affect:  Congruent  Thought Process:  Goal Directed and Descriptions of Associations: Circumstantial  Orientation:  Full (Time, Place, and Person)  Thought Content: Logical   Suicidal Thoughts:  No   Homicidal Thoughts:  No  Memory:  WNL  Judgement:  Good  Insight:  Good  Psychomotor Activity:  Normal  Concentration:  Concentration: Good and Attention Span: Good  Recall:  Good  Fund of Knowledge: Good  Language: Good  Assets:  Desire for Improvement Financial Resources/Insurance Housing Resilience Transportation Vocational/Educational  ADL's:  Intact  Cognition: WNL  Prognosis:  Good   DIAGNOSES:  No diagnosis found.  Receiving Psychotherapy: No   RECOMMENDATIONS:  PDMP reviewed.  Gabapentin filled 12/29/2022. Xanax filled 05/21/2022.      Continue Xanax 0.5 mg, 1/2-1 twice daily as needed. Continue Tegretol XR 100 mg, 1 p.o. twice daily. Continue gabapentin 400 mg, 1 p.o. twice daily. Continue Emgality monthly per neurology. Continue gabapentin 400 mg, 1 p.o. twice daily. Continue Nurtec per neurology. Healthy lifestyle and diet were recommended. Recommend counseling. Return in    [place], [place]

## 2023-04-06 ENCOUNTER — Telehealth: Payer: Self-pay | Admitting: Physician Assistant

## 2023-04-06 ENCOUNTER — Other Ambulatory Visit: Payer: Self-pay

## 2023-04-06 MED ORDER — GABAPENTIN 400 MG PO CAPS: 400.0000 mg | ORAL_CAPSULE | Freq: Two times a day (BID) | ORAL | 1 refills | Status: DC

## 2023-04-06 NOTE — Telephone Encounter (Signed)
pended

## 2023-04-06 NOTE — Telephone Encounter (Signed)
Pt called and said that [facility] told him that gabapentin has been discontinued.  So he would like a 90 day supply sent to [facility] in [place].

## 2023-06-17 ENCOUNTER — Ambulatory Visit: Payer: BC Managed Care – PPO | Admitting: Physician Assistant

## 2023-07-19 ENCOUNTER — Other Ambulatory Visit: Payer: Self-pay | Admitting: Physician Assistant

## 2023-07-29 ENCOUNTER — Ambulatory Visit (INDEPENDENT_AMBULATORY_CARE_PROVIDER_SITE_OTHER): Payer: BC Managed Care – PPO | Admitting: Physician Assistant

## 2023-07-29 ENCOUNTER — Encounter: Payer: Self-pay | Admitting: Physician Assistant

## 2023-07-29 DIAGNOSIS — F319 Bipolar disorder, unspecified: Secondary | ICD-10-CM

## 2023-07-29 DIAGNOSIS — G4701 Insomnia due to medical condition: Secondary | ICD-10-CM | POA: Diagnosis not present

## 2023-07-29 DIAGNOSIS — F411 Generalized anxiety disorder: Secondary | ICD-10-CM | POA: Diagnosis not present

## 2023-07-29 MED ORDER — GABAPENTIN 400 MG PO CAPS: 400.0000 mg | ORAL_CAPSULE | Freq: Two times a day (BID) | ORAL | 0 refills | Status: DC

## 2023-07-29 NOTE — Progress Notes (Unsigned)
 [facility]  Patient ID: Gregory Howe,  MRN: [id number]  PCP: Joya [name]  Date of Evaluation: 07/29/2023 Time spent:20 minutes  Chief Complaint:  Chief Complaint   Depression; Anxiety; Follow-up    HISTORY/CURRENT STATUS: For routine med check.   He has been out of work for the past month.  He had L-spine surgery.  The pain in his groin and down his legs are much better.  He had been dealing with this for a very long time.  He will probably be going back to work next month.  As far as his mental health goes he is doing well. Patient is able to enjoy things.  Energy and motivation are good.  He still under a tremendous amount of stress at work, people are getting laid off so he is not sure what type of mess he will get into when he goes back.  No extreme sadness, tearfulness, or feelings of hopelessness.  Sleeps well most of the time. ADLs and personal hygiene are normal.   Denies any changes in concentration, making decisions, or remembering things.  Appetite has not changed.  Weight is stable.  Still gets anxious, more of a sense of being overwhelmed like something bad may happen.  Xanax is still effective.  Denies suicidal or homicidal thoughts.  Patient denies increased energy with decreased need for sleep, increased talkativeness, racing thoughts, impulsivity or risky behaviors, increased spending, increased libido, grandiosity, increased irritability or anger, paranoia, or hallucinations.  Denies dizziness, syncope, seizures, numbness, tingling, tremor, tics, unsteady gait, slurred speech, confusion.  Chronic back pain as noted above.  Individual Medical History/ Review of Systems: Changes? :No    06/24/2023 had back surgery, L-spine, doing much better.  Past medications for mental health diagnoses include: Latuda, Depakote, Lexapro, Lamictal, Ambien, Risperdal caused nightmares,  Xanax, Tegretol was effective.  Seroquel caused severe sedation, Zoloft, gabapentin  at higher doses caused blurred vision and dizziness. Modafinil caused dizziness and headaches. Trileptal caused nausea and severe h/a. Paxil was "awful.'"  Zyprexa  Allergies: Trileptal [oxcarbazepine], Lamictal [lamotrigine], and Modafinil  Current Medications:  Current Outpatient Medications:    ALPRAZolam (XANAX) 0.5 MG tablet, Take 1 tablet (0.5 mg total) by mouth 2 (two) times daily as needed. for anxiety, Disp: 180 tablet, Rfl: 1   carbamazepine (TEGRETOL XR) 100 MG 12 hr tablet, Take 1 tablet (100 mg total) by mouth 2 (two) times daily., Disp: 180 tablet, Rfl: 1   Cholecalciferol (VITAMIN D3) 125 MCG (5000 UT) CAPS, Take 1 tablet by mouth daily., Disp: , Rfl:    Galcanezumab-gnlm (EMGALITY) 120 MG/ML SOAJ, Inject 120 mg into the skin every 30 (thirty) days., Disp: 1.12 mL, Rfl: 11   lisinopril (PRINIVIL,ZESTRIL) 5 MG tablet, , Disp: , Rfl:    meloxicam (MOBIC) 15 MG tablet, Take by mouth., Disp: , Rfl:    metFORMIN (GLUCOPHAGE) 500 MG tablet, Take 500 mg by mouth 2 (two) times daily with a meal. Metformin ER- 2 in the am, 2 in the pm, Disp: , Rfl:    metoprolol tartrate (LOPRESSOR) 50 MG tablet, 1 tablet in the am, 1/2 tablet in pm, Disp: , Rfl:    Multiple Vitamin (MULTIVITAMIN) tablet, Take 1 tablet by mouth daily., Disp: , Rfl:    Omega-3 Fatty Acids (OMEGA 3 500 PO), Take 500 mg by mouth., Disp: , Rfl:    baclofen (LIORESAL) 10 MG tablet, Take 10 mg by mouth 3 (three) times daily as needed. (Patient not taking: Reported on 07/29/2023), Disp: ,  Rfl:    gabapentin (NEURONTIN) 400 MG capsule, Take 1 capsule (400 mg total) by mouth 2 (two) times daily., Disp: 180 capsule, Rfl: 0   Rimegepant Sulfate (NURTEC) 75 MG TBDP, Take 1 tablet (75 mg total) by mouth daily as needed (Take 1 tablet at onset of migraine(do NOT exceed more than 1 tablet in 24 hrs)). (Patient not taking: Reported on 07/29/2023), Disp: 8 tablet, Rfl: 5 Medication Side Effects:  chest pain, with Lybalvi        Family  Medical/ Social History: Changes?  No  MENTAL HEALTH EXAM:  There were no vitals taken for this visit.There is no height or weight on file to calculate BMI.  General Appearance: Casual, Neat, Well Groomed and Obese  Eye Contact:  Good  Speech:  Clear and Coherent and Normal Rate  Volume:  Normal  Mood:  Euthymic  Affect:  Congruent  Thought Process:  Goal Directed and Descriptions of Associations: Circumstantial  Orientation:  Full (Time, Place, and Person)  Thought Content: Logical   Suicidal Thoughts:  No  Homicidal Thoughts:  No  Memory:  WNL  Judgement:  Good  Insight:  Good  Psychomotor Activity:  Normal  Concentration:  Concentration: Good and Attention Span: Good  Recall:  Good  Fund of Knowledge: Good  Language: Good  Assets:  Desire for Improvement Financial Resources/Insurance Housing Resilience Transportation Vocational/Educational  ADL's:  Intact  Cognition: WNL  Prognosis:  Good   DIAGNOSES:    ICD-10-CM   1. Bipolar I disorder (HCC)  F31.9     2. Generalized anxiety disorder  F41.1     3. Insomnia due to medical condition  G47.01       Receiving Psychotherapy: No   RECOMMENDATIONS:  PDMP reviewed.  Gabapentin filled 07/06/2023.  Xanax filled 07/14/2023. I provided 20 minutes of face to face time during this encounter, including time spent before and after the visit in records review, medical decision making, counseling pertinent to today's visit, and charting.   I am glad to hear that the surgery was successful and hope he continues to recover soon.  As far as his mental health medications go he is doing well so no changes will be made.  Continue Xanax 0.5 mg, 1/2-1 twice daily as needed. Continue Tegretol XR 100 mg, 1 p.o. twice daily. Continue gabapentin 400 mg, 1 p.o. twice daily. Continue Emgality monthly per neurology. Continue gabapentin 400 mg, 1 p.o. twice daily. Continue Nurtec per neurology. Healthy lifestyle and diet were  recommended. Recommend counseling. Return in 2 months.  Melony Overly, PA-C

## 2023-08-14 ENCOUNTER — Telehealth: Payer: Self-pay

## 2023-08-14 ENCOUNTER — Other Ambulatory Visit (HOSPITAL_COMMUNITY): Payer: Self-pay

## 2023-08-14 NOTE — Telephone Encounter (Signed)
 Pharmacy Patient Advocate Encounter   Received notification from CoverMyMeds that prior authorization for Emgality 120MG /ML auto-injectors (migraine) is required/requested.   Insurance verification completed.   The patient is insured through [facility] .   Per test claim:  Refill too soon. PA is not needed at this time. Medication was filled 08-14-2023 with [facility] for 3mL. Next eligible fill date is 10-21-2023.

## 2023-09-28 ENCOUNTER — Ambulatory Visit: Payer: BC Managed Care – PPO | Admitting: Physician Assistant

## 2023-10-29 ENCOUNTER — Other Ambulatory Visit: Payer: Self-pay | Admitting: Physician Assistant

## 2023-11-01 ENCOUNTER — Other Ambulatory Visit: Payer: Self-pay | Admitting: Physician Assistant

## 2023-11-01 ENCOUNTER — Other Ambulatory Visit: Payer: Self-pay | Admitting: Neurology

## 2023-11-16 ENCOUNTER — Telehealth: Payer: Self-pay | Admitting: Physician Assistant

## 2023-11-16 ENCOUNTER — Encounter: Payer: Self-pay | Admitting: Physician Assistant

## 2023-11-16 ENCOUNTER — Ambulatory Visit: Admitting: Physician Assistant

## 2023-11-16 DIAGNOSIS — F319 Bipolar disorder, unspecified: Secondary | ICD-10-CM

## 2023-11-16 DIAGNOSIS — F411 Generalized anxiety disorder: Secondary | ICD-10-CM | POA: Diagnosis not present

## 2023-11-16 DIAGNOSIS — G4701 Insomnia due to medical condition: Secondary | ICD-10-CM | POA: Diagnosis not present

## 2023-11-16 DIAGNOSIS — G43109 Migraine with aura, not intractable, without status migrainosus: Secondary | ICD-10-CM | POA: Diagnosis not present

## 2023-11-16 MED ORDER — ALPRAZOLAM 0.5 MG PO TABS: 0.5000 mg | ORAL_TABLET | Freq: Two times a day (BID) | ORAL | 1 refills | Status: DC | PRN

## 2023-11-16 NOTE — Progress Notes (Signed)
 [facility]  Patient ID: Gregory Howe,  MRN: [id number]  PCP: Orbie Binder  Date of Evaluation: 11/16/2023 Time spent:25 minutes  Chief Complaint:  Chief Complaint   Anxiety; Depression; Follow-up    HISTORY/CURRENT STATUS: For routine med check.   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 It's still stressful but better than his previous position.   Energy and motivation are good.  No extreme sadness, tearfulness, or feelings of hopelessness.  But he feels emotions deeply, whether it's anger, sadness.  Sleeps ok with the Xanax .  He only takes it at night, and usually just 1/2 pill.  No PA.  ADLs and personal hygiene are normal.   No changes in concentration, making decisions, or memory.  Appetite has not changed.  No manic sx, psychosis, or delirium. Denies suicidal or homicidal thoughts.  Denies dizziness, syncope, seizures, numbness, tingling, tremor, tics, unsteady gait, slurred speech, confusion.  Chronic back pain but much better after his surgery earlier this year.  Migraines are well controlled since he is on the Emgality .  Individual Medical History/ Review of Systems: Changes? :No      Past medications for mental health diagnoses include: Latuda, Depakote, Lexapro, Lamictal, Ambien, Risperdal caused nightmares,  Xanax , Tegretol  was effective.  Seroquel caused severe sedation, Zoloft, gabapentin  at higher doses caused blurred vision and dizziness. Modafinil  caused dizziness and headaches. Trileptal  caused nausea and severe h/a. Paxil was awful.'  Zyprexa   Allergies: Trileptal  [oxcarbazepine ], Lamictal [lamotrigine], and Modafinil   Current Medications:  Current Outpatient Medications:    baclofen (LIORESAL) 10 MG tablet, Take 10 mg by mouth 3 (three) times daily as needed., Disp: , Rfl:    carbamazepine  (TEGRETOL  XR) 100 MG 12 hr tablet, TAKE 1 TABLET BY MOUTH TWICE  DAILY, Disp: 180 tablet, Rfl: 0    Cholecalciferol (VITAMIN D3) 125 MCG (5000 UT) CAPS, Take 1 tablet by mouth daily., Disp: , Rfl:    gabapentin  (NEURONTIN ) 400 MG capsule, TAKE 1 CAPSULE BY MOUTH TWICE  DAILY, Disp: 180 capsule, Rfl: 1   Galcanezumab -gnlm (EMGALITY ) 120 MG/ML SOAJ, Inject 120 mg into the skin every 30 (thirty) days., Disp: 1.12 mL, Rfl: 11   lisinopril (PRINIVIL,ZESTRIL) 5 MG tablet, , Disp: , Rfl:    meloxicam (MOBIC) 15 MG tablet, Take by mouth., Disp: , Rfl:    metFORMIN (GLUCOPHAGE) 500 MG tablet, Take 500 mg by mouth 2 (two) times daily with a meal. Metformin ER- 2 in the am, 2 in the pm, Disp: , Rfl:    metoprolol tartrate (LOPRESSOR) 50 MG tablet, 1 tablet in the am, 1/2 tablet in pm, Disp: , Rfl:    Multiple Vitamin (MULTIVITAMIN) tablet, Take 1 tablet by mouth daily., Disp: , Rfl:    Omega-3 Fatty Acids (OMEGA 3 500 PO), Take 500 mg by mouth., Disp: , Rfl:    Rimegepant Sulfate (NURTEC) 75 MG TBDP, Take 1 tablet (75 mg total) by mouth daily as needed (Take 1 tablet at onset of migraine(do NOT exceed more than 1 tablet in 24 hrs))., Disp: 8 tablet, Rfl: 5   ALPRAZolam  (XANAX ) 0.5 MG tablet, Take 1 tablet (0.5 mg total) by mouth 2 (two) times daily as needed. for anxiety, Disp: 180 tablet, Rfl: 1 Medication Side Effects: chest pain, with Lybalvi        Family Medical/ Social History: Changes?  No  MENTAL HEALTH EXAM:  There were no vitals taken for this visit.There is no height or weight on  file to calculate BMI.  General Appearance: Casual and Well Groomed  Eye Contact:  Good  Speech:  Clear and Coherent and Normal Rate  Volume:  Normal  Mood:  Euthymic  Affect:  Congruent  Thought Process:  Goal Directed and Descriptions of Associations: Circumstantial  Orientation:  Full (Time, Place, and Person)  Thought Content: Logical   Suicidal Thoughts:  No  Homicidal Thoughts:  No  Memory:  WNL  Judgement:  Good  Insight:  Good  Psychomotor Activity:  Normal  Concentration:  Concentration: Good and  Attention Span: Good  Recall:  Good  Fund of Knowledge: Good  Language: Good  Assets:  Desire for Improvement Financial Resources/Insurance Housing Resilience Transportation Vocational/Educational  ADL's:  Intact  Cognition: WNL  Prognosis:  Good   PCP follows labs  DIAGNOSES:    ICD-10-CM   1. Bipolar I disorder (HCC)  F31.9     2. Generalized anxiety disorder  F41.1     3. Insomnia due to medical condition  G47.01     4. Migraine with aura and without status migrainosus, not intractable  G43.109      Receiving Psychotherapy: No   RECOMMENDATIONS:  PDMP reviewed.  Gabapentin  filled 11/04/2023.  Xanax  filled 07/14/2023.  Oxycodone filled 06/26/2023.  Gabapentin  known to me. I provided 25 minutes of face to face time during this encounter, including time spent before and after the visit in records review, medical decision making, counseling pertinent to today's visit, and charting.   He's doing well on current meds so no changes are needed.   DOT paperwork given to front office staff, same info as 01/2023.   Continue Xanax  0.5 mg, 1/2-1 twice daily as needed. Continue Tegretol  XR 100 mg, 1 p.o. twice daily. Continue gabapentin  400 mg, 1 p.o. twice daily. Continue Emgality  monthly per neurology. Continue gabapentin  400 mg, 1 p.o. twice daily. Continue Nurtec per neurology. Return in 4 months.  Marvia Slocumb, PA-C

## 2023-11-16 NOTE — Telephone Encounter (Signed)
 Pt LVM DOT form.  Ammon Bales advised in pt encounter, no charge and to mail to him when it's completed.  Putting in your mailbox.

## 2023-11-17 ENCOUNTER — Encounter: Payer: Self-pay | Admitting: Neurology

## 2023-11-17 ENCOUNTER — Telehealth: Payer: Self-pay

## 2023-11-17 ENCOUNTER — Other Ambulatory Visit (HOSPITAL_COMMUNITY): Payer: Self-pay

## 2023-11-17 NOTE — Telephone Encounter (Signed)
 Pt is sending a mychart message indicating that he is needing auth for his emgality . Looks like in march it was too soon.

## 2023-11-17 NOTE — Telephone Encounter (Signed)
 Pharmacy Patient Advocate Encounter   Received notification from Physician's Office that prior authorization for Emgality  120MG /ML auto-injectors (migraine)  is required/requested.   Insurance verification completed.   The patient is insured through [facility] .   Per test claim: PA required; PA submitted to above mentioned insurance via CoverMyMeds Key/confirmation #/EOC [id number] Status is pending

## 2023-11-17 NOTE — Telephone Encounter (Signed)
 Form completed for DOT given to admin staff

## 2023-11-20 NOTE — Telephone Encounter (Signed)
 Pharmacy Patient Advocate Encounter  Received notification from [facility] that Prior Authorization for Emgality  120MG /ML auto-injectors (migraine) has been DENIED.  Full denial letter will be uploaded to the media tab. See denial reason below.   PA #/Case ID/Reference #:  [id number]

## 2023-11-23 ENCOUNTER — Encounter: Payer: Self-pay | Admitting: Neurology

## 2023-11-25 ENCOUNTER — Telehealth: Payer: Self-pay | Admitting: Pharmacist

## 2023-11-25 NOTE — Telephone Encounter (Signed)
 An E-Appeal has been submitted for Emgality . Will advise when response is received, please be advised that most companies may take 30 days to make a decision. Appeal letter and supporting documentation have been uploaded and submitted via CMM Website.  Thank you, Devere Pandy, PharmD Clinical Pharmacist  [place]  Direct Dial: [phone]

## 2023-11-25 NOTE — Telephone Encounter (Signed)
 Thank you :)

## 2023-11-25 NOTE — Telephone Encounter (Signed)
 I will forward to pharmacist for an appeals review

## 2023-11-25 NOTE — Telephone Encounter (Signed)
 Can this be reviewed by pharmacist for appeal? We've had to appeal in past. He's been on Emgality  through our office since 2023 with good benefit and he has already tried many medications including Aimovig (nausea) and Ajovy (ineffective), plus he's on Nurtec for acute treatment.

## 2023-12-07 NOTE — Telephone Encounter (Signed)
 The appeal for Emgality  was denied for the following reason:

## 2023-12-16 ENCOUNTER — Telehealth: Admitting: Neurology

## 2023-12-16 DIAGNOSIS — G43709 Chronic migraine without aura, not intractable, without status migrainosus: Secondary | ICD-10-CM

## 2023-12-16 DIAGNOSIS — G43909 Migraine, unspecified, not intractable, without status migrainosus: Secondary | ICD-10-CM | POA: Insufficient documentation

## 2023-12-16 DIAGNOSIS — G43009 Migraine without aura, not intractable, without status migrainosus: Secondary | ICD-10-CM

## 2023-12-16 MED ORDER — NURTEC 75 MG PO TBDP: 75.0000 mg | ORAL_TABLET | Freq: Every day | ORAL | 5 refills | Status: AC | PRN

## 2023-12-16 MED ORDER — QULIPTA 60 MG PO TABS: 60.0000 mg | ORAL_TABLET | Freq: Every day | ORAL | 11 refills | Status: DC

## 2023-12-16 NOTE — Patient Instructions (Signed)
 Start Qulipta  60 mg daily for migraine prevention.  Continue Nurtec as needed. Take 1 tablet at onset of headache, max is 1 tablet in 24 hours.  Call if you cannot tolerate Qulipta  due to side effects or if not effective.  I will see you in September.  Thanks!!

## 2023-12-16 NOTE — Progress Notes (Signed)
 Patient: Gregory Howe Date of Birth: December 05, 1965  Reason for Visit: Follow up History from: Patient Primary Neurologist: [facility]  Virtual Visit via Video Note  I connected with Gregory Howe on 12/16/23 at 10:30 AM EDT by a video enabled telemedicine application and verified that I am speaking with the correct person using two identifiers.  Location: Patient: at his home Provider: in the office    I discussed the limitations of evaluation and management by telemedicine and the availability of in person appointments. The patient expressed understanding and agreed to proceed.  ASSESSMENT AND PLAN 58 y.o. year old male with history of anxiety, bipolar disorder, type 2 diabetes, hypertension, chronic migraine headache.  - Insurance is requiring him to try Qulipta  before approving Emgality   - Start Qulipta  60 mg daily for migraine prevention - Continue Nurtec 75 mg tablet as needed for acute migraine - Next steps: If fails Qulipta , will go back to Emgality , at that point he should have met all requirements based on the insurance denial - Has follow-up appointment in September, can keep this  HISTORY OF PRESENT ILLNESS: Today 12/16/23 Here for VV.  He was on Emgality , but insurance recently denied requiring him to try Ajovy, Aimovig, Qulipta , Nurtec. However, he has tried Aimovig (nausea) and Ajovy.(Ineffective), is on Nurtec for acute treatment. Has been out of Emgality  since the 1st of the month. With Emgality , had resolution of migraines. Rarely needed nurtec. Prior to Emgality , would have 2 migraines a day, would have to lay down. Since off the Emgality , he can tell head pressure if returning, affecting his vision, seems to kind of stay there, which resolved with Emgality .  HISTORY  02/12/23 Dr. Rush Brief HPI: 58 year old male with a history of anxiety, Bipolar disorder, DM2, HTN who follows in clinic for migraines.  MRI brain 09/2021 was unremarkable.   At his last visit he was  continued on Emgality  for prevention and started on Nurtec for rescue.   Interval History: Headaches are well-controlled on Emgality . Has only taken Nurtec a couple of times since his last visit. It works well for rescue. His vertigo has resolved.    Migraine days per month: 0 Headache free days per month: 30   Current Headache Regimen: Preventative: Emgality  120 mg monthly Abortive: Nurtec 75 mg PRN     Prior Therapies                                  Preventive: Lamictal Gabapentin  400 mg BID Tegretol  100 mg BID Lisinopril 5 mg daily Cymbalta  Topamax Metoprolol 50/25 Ajovy - lack of efficacy Emgality  - effective Aimovig - nausea Botox   Rescue: Maxalt  10 mg PRN Nurtec 75 mg PRN  REVIEW OF SYSTEMS: Out of a complete 14 system review of symptoms, the patient complains only of the following symptoms, and all other reviewed systems are negative.  See HPI  ALLERGIES: Allergies  Allergen Reactions   Trileptal  [Oxcarbazepine ] Nausea And Vomiting   Lamictal [Lamotrigine] Other (See Comments)    headaches   Modafinil  Other (See Comments)    headache    HOME MEDICATIONS: Outpatient Medications Prior to Visit  Medication Sig Dispense Refill   ALPRAZolam  (XANAX ) 0.5 MG tablet Take 1 tablet (0.5 mg total) by mouth 2 (two) times daily as needed. for anxiety 180 tablet 1   baclofen (LIORESAL) 10 MG tablet Take 10 mg by mouth 3 (three) times daily as needed.  carbamazepine  (TEGRETOL  XR) 100 MG 12 hr tablet TAKE 1 TABLET BY MOUTH TWICE  DAILY 180 tablet 0   Cholecalciferol (VITAMIN D3) 125 MCG (5000 UT) CAPS Take 1 tablet by mouth daily.     gabapentin  (NEURONTIN ) 400 MG capsule TAKE 1 CAPSULE BY MOUTH TWICE  DAILY 180 capsule 1   Galcanezumab -gnlm (EMGALITY ) 120 MG/ML SOAJ Inject 120 mg into the skin every 30 (thirty) days. 1.12 mL 11   lisinopril (PRINIVIL,ZESTRIL) 5 MG tablet      meloxicam (MOBIC) 15 MG tablet Take by mouth.     metFORMIN (GLUCOPHAGE) 500 MG tablet  Take 500 mg by mouth 2 (two) times daily with a meal. Metformin ER- 2 in the am, 2 in the pm     metoprolol tartrate (LOPRESSOR) 50 MG tablet 1 tablet in the am, 1/2 tablet in pm     Multiple Vitamin (MULTIVITAMIN) tablet Take 1 tablet by mouth daily.     Omega-3 Fatty Acids (OMEGA 3 500 PO) Take 500 mg by mouth.     Rimegepant Sulfate (NURTEC) 75 MG TBDP Take 1 tablet (75 mg total) by mouth daily as needed (Take 1 tablet at onset of migraine(do NOT exceed more than 1 tablet in 24 hrs)). 8 tablet 5   No facility-administered medications prior to visit.    PAST MEDICAL HISTORY: Past Medical History:  Diagnosis Date   Ankle pain    Anxiety    Back pain    Bipolar 1 disorder (HCC)    Depression    Diabetes (HCC)    Gallbladder problem    Hypertension    Kidney disease    Knee pain    Migraine    Overweight    Sleep apnea     PAST SURGICAL HISTORY: Past Surgical History:  Procedure Laterality Date   CARPAL TUNNEL RELEASE Bilateral    CHOLECYSTECTOMY      FAMILY HISTORY: Family History  Problem Relation Age of Onset   Depression Mother    Hypertension Mother    Anxiety disorder Mother    Cancer Father     SOCIAL HISTORY: Social History   Socioeconomic History   Marital status: Married    Spouse name: Adrien   Number of children: Not on file   Years of education: Not on file   Highest education level: Associate degree: academic program  Occupational History   Occupation: Occupational hygienist: [employer]  Tobacco Use   Smoking status: Never   Smokeless tobacco: Never  Substance and Sexual Activity   Alcohol use: Not Currently   Drug use: Not Currently   Sexual activity: Not on file  Other Topics Concern   Not on file  Social History Narrative   Lives with wife   Caffeine- soda 1 a day, tea 2 glasses, Monster 1 a day   Social Drivers of Corporate investment banker Strain: Not on file  Food Insecurity: Not on file  Transportation Needs: Not on file   Physical Activity: Not on file  Stress: Not on file  Social Connections: Not on file  Intimate Partner Violence: Not on file   PHYSICAL EXAM  There were no vitals filed for this visit. There is no height or weight on file to calculate BMI.  Generalized: Well developed, in no acute distress  Via video visit, is alert and oriented, speech is clear and concise, moves about freely   DIAGNOSTIC DATA (LABS, IMAGING, TESTING) - I reviewed patient records, labs, notes, testing  and imaging myself where available.  Lab Results  Component Value Date   WBC 5.6 02/27/2022   HGB 15.8 02/27/2022   HCT 44.1 02/27/2022   MCV 91 02/27/2022   PLT 185 02/27/2022      Component Value Date/Time   NA 138 02/27/2022 0845   K 4.5 02/27/2022 0845   CL 98 02/27/2022 0845   CO2 23 02/27/2022 0845   GLUCOSE 133 (H) 02/27/2022 0845   BUN 16 02/27/2022 0845   CREATININE 1.06 02/27/2022 0845   CALCIUM 10.0 02/27/2022 0845   PROT 6.8 02/27/2022 0845   ALBUMIN 4.5 02/27/2022 0845   AST 20 02/27/2022 0845   ALT 28 02/27/2022 0845   ALKPHOS 62 02/27/2022 0845   BILITOT 0.2 02/27/2022 0845   GFRNONAA 72 05/30/2020 0814   GFRAA 84 05/30/2020 0814   Lab Results  Component Value Date   CHOL 155 04/10/2021   HDL 37 (L) 04/10/2021   LDLCALC 90 04/10/2021   TRIG 163 (H) 04/10/2021   Lab Results  Component Value Date   HGBA1C 6.0 (H) 04/10/2021   Lab Results  Component Value Date   VITAMINB12 305 04/10/2021   Lab Results  Component Value Date   TSH 2.020 04/10/2021    Lauraine Born, AGNP-C, DNP 12/16/2023, 10:35 AM [facility] [address] [place], [place] [address] [phone]

## 2023-12-27 ENCOUNTER — Other Ambulatory Visit: Payer: Self-pay | Admitting: Physician Assistant

## 2024-01-20 ENCOUNTER — Encounter: Payer: Self-pay | Admitting: Neurology

## 2024-01-21 MED ORDER — EMGALITY 120 MG/ML ~~LOC~~ SOAJ
120.0000 mg | SUBCUTANEOUS | 11 refills | Status: AC
Start: 2024-01-21 — End: ?

## 2024-01-21 NOTE — Telephone Encounter (Addendum)
 Patient tried Qulipta . Had side effects of mood change and constipation making it unbearable to continue taking. It was also not effective for migraine management. We will go back to [facility]. Also reported GI side effect/nausea when taking Ajovy that was significant prompting him to have to stop Ajovy use. He is having > 8 migraine days a month.   Meds ordered this encounter  Medications   Galcanezumab -gnlm (EMGALITY ) 120 MG/ML SOAJ    Sig: Inject 120 mg into the skin every 30 (thirty) days.    Dispense:  1.12 mL    Refill:  11     Prior Therapies                                  Preventive: Lamictal Gabapentin  400 mg BID Tegretol  100 mg BID Lisinopril 5 mg daily Cymbalta  Topamax Metoprolol 50/25 Ajovy - lack of efficacy Emgality  - effective Aimovig - nausea Botox Qulipta -constipation, ineffective, mood changes   Rescue: Maxalt  10 mg PRN Nurtec 75 mg PRN

## 2024-02-05 ENCOUNTER — Telehealth: Payer: Self-pay | Admitting: Pharmacist

## 2024-02-05 NOTE — Telephone Encounter (Signed)
 I spoke with the insurance company in an effort to create the most effective appeal for Emgality . They are now requesting chart notes documenting >=8 migraine days per month and dispense records confirming at least a 12-week trial of Ajovy. I do see it noted that without Emgality  the patient has 2 migraines per day, which would meet the frequency requirement; however, I am unable to locate records showing a 12-week Ajovy trial. Please advise.   Thank you, Devere Pandy, PharmD Clinical Pharmacist  [place]  Direct Dial: [phone]

## 2024-02-10 ENCOUNTER — Telehealth: Payer: Self-pay | Admitting: Pharmacist

## 2024-02-10 NOTE — Telephone Encounter (Signed)
 E-Appeal has been submitted for Emgality . Will advise when response is received, please be advised that most companies may take 30 days to make a decision. Appeal letter and supporting documentation has been uploaded and submitted via CMM Website on 02/10/2024 @1 :12 pm.  Thank you, Devere Pandy, PharmD Clinical Pharmacist  [place]  Direct Dial: [phone]

## 2024-02-11 NOTE — Telephone Encounter (Signed)
 The appeal for Emgality  has FINALLY been approved by the insurance!  It is good through 08/09/2024.  Thank you, Devere Pandy, PharmD Clinical Pharmacist  [place]  Direct Dial: [phone]

## 2024-02-18 ENCOUNTER — Encounter: Payer: Self-pay | Admitting: Neurology

## 2024-02-18 ENCOUNTER — Ambulatory Visit: Payer: BC Managed Care – PPO | Admitting: Neurology

## 2024-02-18 VITALS — BP 120/81 | HR 68 | Ht 70.0 in | Wt 255.0 lb

## 2024-02-18 DIAGNOSIS — G43009 Migraine without aura, not intractable, without status migrainosus: Secondary | ICD-10-CM

## 2024-02-18 NOTE — Patient Instructions (Signed)
 Great to see you today! Continue current medications Call for any issues Follow up in 1 year. Thanks!!

## 2024-02-18 NOTE — Progress Notes (Signed)
 Patient: Gregory Howe Date of Birth: 07/28/1965  Reason for Visit: Follow up History from: Patient Primary Neurologist: [facility]  ASSESSMENT AND PLAN 58 y.o. year old male with history of anxiety, bipolar disorder, type 2 diabetes, hypertension, chronic migraine headache.  - Now back on Emgality .  Thankfully insurance approved!  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 Did injection on Sunday. Headaches starting to ease up. With Emgality , had resolution of headache. Has Nurtec if needed. Stress at work. 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   12/16/23 SS: Here for VV.  He was on Emgality , but insurance recently denied requiring him to try Ajovy, Aimovig, Qulipta , Nurtec. However, he has tried Aimovig (nausea) and Ajovy.(Ineffective), is on Nurtec for acute treatment. Has been out of Emgality  since the 1st of the month. With Emgality , had resolution of migraines. Rarely needed nurtec. Prior to Emgality , would have 2 migraines a day, would have to lay down. Since off the Emgality , he can tell head pressure if returning, affecting his vision, seems to kind of stay there, which resolved with Emgality .  HISTORY  02/12/23 Dr. Rush Brief HPI: 58 year old male with a history of anxiety, Bipolar disorder, DM2, HTN who follows in clinic for migraines.  MRI brain 09/2021 was unremarkable.    At his last visit he was continued on Emgality  for prevention and started on Nurtec for rescue.   Interval History: Headaches are well-controlled on Emgality . Has only taken Nurtec a couple of times since his last visit. It works well for rescue. His vertigo has resolved.    Migraine days per month: 0 Headache free days per month: 30   Current Headache Regimen: Preventative: Emgality  120 mg monthly Abortive: Nurtec 75 mg PRN     Prior Therapies                                  Preventive: Lamictal Gabapentin  400 mg BID Tegretol  100 mg BID Lisinopril 5 mg daily Cymbalta  Topamax Metoprolol 50/25 Ajovy - lack of efficacy Emgality  - effective Aimovig - nausea Botox   Rescue: Maxalt  10 mg PRN Nurtec 75 mg PRN  REVIEW OF SYSTEMS: Out of a complete 14 system review of symptoms, the patient complains only of the following symptoms, and all other reviewed systems are negative.  See HPI  ALLERGIES: Allergies  Allergen Reactions   Trileptal  [Oxcarbazepine ] Nausea And Vomiting   Lamictal [Lamotrigine] Other (See Comments)    headaches   Modafinil  Other (See Comments)    headache    HOME MEDICATIONS: Outpatient Medications Prior to Visit  Medication Sig Dispense Refill   ALPRAZolam  (XANAX ) 0.5 MG tablet Take 1 tablet (0.5 mg total) by mouth 2 (two) times daily as needed. for anxiety 180 tablet 1   baclofen (LIORESAL) 10 MG tablet Take 10 mg by mouth 3 (three) times daily as needed.  carbamazepine  (TEGRETOL  XR) 100 MG 12 hr tablet TAKE 1 TABLET BY MOUTH TWICE  DAILY 180 tablet 1   Cholecalciferol (VITAMIN D3) 125 MCG (5000 UT) CAPS Take 1 tablet by mouth daily.     gabapentin  (NEURONTIN ) 400 MG capsule TAKE 1 CAPSULE BY MOUTH TWICE  DAILY 180 capsule 1   Galcanezumab -gnlm (EMGALITY ) 120 MG/ML SOAJ Inject 120 mg into the skin every 30 (thirty) days. 1.12 mL 11   lisinopril (PRINIVIL,ZESTRIL) 5 MG tablet      meloxicam (MOBIC) 15 MG tablet Take by mouth.     metFORMIN  (GLUCOPHAGE) 500 MG tablet Take 500 mg by mouth 2 (two) times daily with a meal. Metformin ER- 2 in the am, 2 in the pm     metoprolol tartrate (LOPRESSOR) 50 MG tablet 1 tablet in the am, 1/2 tablet in pm     Multiple Vitamin (MULTIVITAMIN) tablet Take 1 tablet by mouth daily.     Omega-3 Fatty Acids (OMEGA 3 500 PO) Take 500 mg by mouth.     Rimegepant Sulfate (NURTEC) 75 MG TBDP Take 1 tablet (75 mg total) by mouth daily as needed (Take 1 tablet at onset of migraine(do NOT exceed more than 1 tablet in 24 hrs)). 8 tablet 5   No facility-administered medications prior to visit.    PAST MEDICAL HISTORY: Past Medical History:  Diagnosis Date   Ankle pain    Anxiety    Back pain    Bipolar 1 disorder (HCC)    Depression    Diabetes (HCC)    Gallbladder problem    Hypertension    Kidney disease    Knee pain    Migraine    Overweight    Sleep apnea     PAST SURGICAL HISTORY: Past Surgical History:  Procedure Laterality Date   CARPAL TUNNEL RELEASE Bilateral    CHOLECYSTECTOMY      FAMILY HISTORY: Family History  Problem Relation Age of Onset   Depression Mother    Hypertension Mother    Anxiety disorder Mother    Cancer Father     SOCIAL HISTORY: Social History   Socioeconomic History   Marital status: Married    Spouse name: Adrien   Number of children: Not on file   Years of education: Not on file   Highest education level: Associate degree: academic program  Occupational History   Occupation: Occupational hygienist: [employer]  Tobacco Use   Smoking status: Never   Smokeless tobacco: Never  Vaping Use   Vaping status: Never Used  Substance and Sexual Activity   Alcohol use: Not Currently   Drug use: Not Currently   Sexual activity: Not on file  Other Topics Concern   Not on file  Social History Narrative   Lives with wife   Caffeine- coffee 1 cup/day,  [place]ster every once in [employer]   Social Drivers of Health   Financial Resource Strain: Not on file   Food Insecurity: Not on file  Transportation Needs: Not on file  Physical Activity: Not on file  Stress: Not on file  Social Connections: Not on file  Intimate Partner Violence: Not on file   PHYSICAL EXAM  Vitals:   02/18/24 1106  BP: 120/81  Pulse: 68  Weight: 255 lb (115.7 kg)  Height: 5' 10 (1.778 m)   Body mass index is 36.59 kg/m.  Physical Exam  General: The patient is alert and cooperative at the time of the examination.  Skin: No  significant peripheral edema is noted.  Neurologic Exam  Mental status: The patient is alert and oriented x 3 at the time of the examination. The patient has apparent normal recent and remote memory, with an apparently normal attention span and concentration ability.  Cranial nerves: Facial symmetry is present. Speech is normal, no aphasia or dysarthria is noted. Extraocular movements are full. Visual fields are full.  Motor: The patient has good strength in all 4 extremities.  Sensory examination: Soft touch sensation is symmetric on the face, arms, and legs.  Coordination: The patient has good finger-nose-finger and heel-to-shin bilaterally.  Gait and station: The patient has a normal gait.   DIAGNOSTIC DATA (LABS, IMAGING, TESTING) - I reviewed patient records, labs, notes, testing and imaging myself where available.  Lab Results  Component Value Date   WBC 5.6 02/27/2022   HGB 15.8 02/27/2022   HCT 44.1 02/27/2022   MCV 91 02/27/2022   PLT 185 02/27/2022      Component Value Date/Time   NA 138 02/27/2022 0845   K 4.5 02/27/2022 0845   CL 98 02/27/2022 0845   CO2 23 02/27/2022 0845   GLUCOSE 133 (H) 02/27/2022 0845   BUN 16 02/27/2022 0845   CREATININE 1.06 02/27/2022 0845   CALCIUM 10.0 02/27/2022 0845   PROT 6.8 02/27/2022 0845   ALBUMIN 4.5 02/27/2022 0845   AST 20 02/27/2022 0845   ALT 28 02/27/2022 0845   ALKPHOS 62 02/27/2022 0845   BILITOT 0.2 02/27/2022 0845   GFRNONAA 72 05/30/2020 0814   GFRAA 84  05/30/2020 0814   Lab Results  Component Value Date   CHOL 155 04/10/2021   HDL 37 (L) 04/10/2021   LDLCALC 90 04/10/2021   TRIG 163 (H) 04/10/2021   Lab Results  Component Value Date   HGBA1C 6.0 (H) 04/10/2021   Lab Results  Component Value Date   VITAMINB12 305 04/10/2021   Lab Results  Component Value Date   TSH 2.020 04/10/2021    Lauraine Born, AGNP-C, DNP 02/18/2024, 11:26 AM [facility] [address] [place], [place] [address] [phone]

## 2024-02-29 ENCOUNTER — Telehealth: Payer: Self-pay | Admitting: Physician Assistant

## 2024-02-29 ENCOUNTER — Encounter: Payer: Self-pay | Admitting: Physician Assistant

## 2024-02-29 ENCOUNTER — Ambulatory Visit (INDEPENDENT_AMBULATORY_CARE_PROVIDER_SITE_OTHER): Admitting: Physician Assistant

## 2024-02-29 DIAGNOSIS — F3160 Bipolar disorder, current episode mixed, unspecified: Secondary | ICD-10-CM | POA: Diagnosis not present

## 2024-02-29 DIAGNOSIS — F411 Generalized anxiety disorder: Secondary | ICD-10-CM

## 2024-02-29 DIAGNOSIS — Z79899 Other long term (current) drug therapy: Secondary | ICD-10-CM | POA: Diagnosis not present

## 2024-02-29 DIAGNOSIS — R454 Irritability and anger: Secondary | ICD-10-CM | POA: Diagnosis not present

## 2024-02-29 MED ORDER — CARBAMAZEPINE ER 100 MG PO TB12: 100.0000 mg | ORAL_TABLET | Freq: Every evening | ORAL | Status: DC

## 2024-02-29 MED ORDER — LITHIUM CARBONATE 300 MG PO CAPS: ORAL_CAPSULE | ORAL | 1 refills | Status: DC

## 2024-02-29 NOTE — Telephone Encounter (Signed)
Pt is scheduled at 330 today. 
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
## 2024-02-29 NOTE — Telephone Encounter (Signed)
 Please see message and schedule as noted.

## 2024-02-29 NOTE — Telephone Encounter (Signed)
 Please call and have him come in at 3:30.
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
## 2024-03-03 ENCOUNTER — Telehealth: Payer: Self-pay | Admitting: Physician Assistant

## 2024-03-03 DIAGNOSIS — Z0289 Encounter for other administrative examinations: Secondary | ICD-10-CM

## 2024-03-03 NOTE — Telephone Encounter (Signed)
 Received fax from [employer] to complete FMLA forms. Called pt to advise charge $45. Must be paid prior to completion.

## 2024-03-03 NOTE — Telephone Encounter (Signed)
 Noted

## 2024-03-03 NOTE — Telephone Encounter (Signed)
 Pt called to pay. Put in Traci's box

## 2024-03-04 NOTE — Telephone Encounter (Signed)
 FMLA completed per [employer]  Faxed per request

## 2024-03-12 LAB — BASIC METABOLIC PANEL WITH GFR
BUN/Creatinine Ratio: 11 (ref 9–20)
BUN: 13 mg/dL (ref 6–24)
CO2: 25 mmol/L (ref 20–29)
Calcium: 11.1 mg/dL — ABNORMAL HIGH (ref 8.7–10.2)
Chloride: 97 mmol/L (ref 96–106)
Creatinine, Ser: 1.18 mg/dL (ref 0.76–1.27)
Glucose: 125 mg/dL — ABNORMAL HIGH (ref 70–99)
Potassium: 5 mmol/L (ref 3.5–5.2)
Sodium: 138 mmol/L (ref 134–144)
eGFR: 72 mL/min/1.73 (ref >59)

## 2024-03-12 LAB — LITHIUM LEVEL: Lithium Lvl: 0.5 mmol/L (ref 0.5–1.2)

## 2024-03-12 LAB — TSH: TSH: 2.37 u[IU]/mL (ref 0.450–4.500)

## 2024-03-14 ENCOUNTER — Ambulatory Visit: Payer: Self-pay | Admitting: Physician Assistant

## 2024-03-14 NOTE — Progress Notes (Signed)
 He has an appointment tomorrow and I will discuss labs in detail then.  His blood sugar is slightly elevated as is calcium but not significant at this point.  TSH is normal.  Kidney tests are normal.  Lithium is 0.5, increase lithium 300 mg to 3 p.o. nightly.

## 2024-03-14 NOTE — Progress Notes (Signed)
 LVM to [facility]

## 2024-03-15 ENCOUNTER — Ambulatory Visit: Admitting: Physician Assistant

## 2024-03-15 ENCOUNTER — Encounter: Payer: Self-pay | Admitting: Physician Assistant

## 2024-03-15 DIAGNOSIS — F3160 Bipolar disorder, current episode mixed, unspecified: Secondary | ICD-10-CM

## 2024-03-15 DIAGNOSIS — F411 Generalized anxiety disorder: Secondary | ICD-10-CM | POA: Diagnosis not present

## 2024-03-15 DIAGNOSIS — Z79899 Other long term (current) drug therapy: Secondary | ICD-10-CM | POA: Diagnosis not present

## 2024-03-15 MED ORDER — GABAPENTIN 400 MG PO CAPS: 400.0000 mg | ORAL_CAPSULE | Freq: Two times a day (BID) | ORAL | 1 refills | Status: DC

## 2024-03-15 MED ORDER — LITHIUM CARBONATE 300 MG PO CAPS: 900.0000 mg | ORAL_CAPSULE | Freq: Every evening | ORAL | 1 refills | Status: AC

## 2024-03-15 NOTE — Progress Notes (Signed)
 [facility]  Patient ID: Remy Voiles,  MRN: [id number]  PCP: Mavis Coy  Date of Evaluation: 03/15/2024 Time spent:30 minutes  Chief Complaint:  Chief Complaint   Anxiety; Depression; Follow-up    HISTORY/CURRENT STATUS: For 2 week f/u  We increased the Lithium to 900 mg yesterday.  States he feels better than he did a few weeks ago when he was seen urgently.  He feels drained though.  Being out of work has been helpful because it is a very toxic environment.  It is also giving him time to have the lithium get in his system.  He is not having any side effects from it and thinks he may feel a little better as far as the depression goes.  Since being out of work, he has been relaxing, working on his [employer].  No extreme sadness, tearfulness, or feelings of hopelessness.  Sleeps ok.   ADLs and personal hygiene are normal.   Denies any changes in concentration, making decisions, or remembering things.  He is controlled.  He takes Xanax  when needed and it is effective.  Appetite has not changed.  Weight is stable.  No SI/HI.  No reports of increased energy with decreased need for sleep, increased talkativeness, racing thoughts, impulsivity or risky behaviors, increased spending, increased libido, grandiosity, increased irritability or anger, paranoia, or hallucinations.  Individual Medical History/ Review of Systems: Changes? :No      Past medications for mental health diagnoses include: Latuda, Depakote caused inc ammonia, Lexapro, Lamictal, Ambien, Risperdal caused nightmares,  Xanax , Tegretol  was effective.  Seroquel caused severe sedation, Zoloft, gabapentin  at higher doses caused blurred vision and dizziness. Modafinil  caused dizziness and headaches. Trileptal  caused nausea and severe h/a. Paxil was awful.'  Zyprexa , Lybalvi   Allergies: Trileptal  [oxcarbazepine ], Lamictal [lamotrigine], and Modafinil   Current Medications:  Current Outpatient Medications:     ALPRAZolam  (XANAX ) 0.5 MG tablet, Take 1 tablet (0.5 mg total) by mouth 2 (two) times daily as needed. for anxiety, Disp: 180 tablet, Rfl: 1   baclofen (LIORESAL) 10 MG tablet, Take 10 mg by mouth 3 (three) times daily as needed., Disp: , Rfl:    carbamazepine  (TEGRETOL  XR) 100 MG 12 hr tablet, Take 1 tablet (100 mg total) by mouth at bedtime., Disp: , Rfl:    Cholecalciferol (VITAMIN D3) 125 MCG (5000 UT) CAPS, Take 1 tablet by mouth daily., Disp: , Rfl:    Galcanezumab -gnlm (EMGALITY ) 120 MG/ML SOAJ, Inject 120 mg into the skin every 30 (thirty) days., Disp: 1.12 mL, Rfl: 11   lisinopril (PRINIVIL,ZESTRIL) 5 MG tablet, , Disp: , Rfl:    meloxicam (MOBIC) 15 MG tablet, Take by mouth., Disp: , Rfl:    metFORMIN (GLUCOPHAGE) 500 MG tablet, Take 500 mg by mouth 2 (two) times daily with a meal. Metformin ER- 2 in the am, 2 in the pm, Disp: , Rfl:    metoprolol tartrate (LOPRESSOR) 50 MG tablet, 1 tablet in the am, 1/2 tablet in pm, Disp: , Rfl:    Multiple Vitamin (MULTIVITAMIN) tablet, Take 1 tablet by mouth daily., Disp: , Rfl:    Omega-3 Fatty Acids (OMEGA 3 500 PO), Take 500 mg by mouth., Disp: , Rfl:    Rimegepant Sulfate (NURTEC) 75 MG TBDP, Take 1 tablet (75 mg total) by mouth daily as needed (Take 1 tablet at onset of migraine(do NOT exceed more than 1 tablet in 24 hrs))., Disp: 8 tablet, Rfl: 5   gabapentin  (NEURONTIN ) 400 MG capsule, Take 1 capsule (400 mg  total) by mouth 2 (two) times daily., Disp: 180 capsule, Rfl: 1   lithium carbonate 300 MG capsule, Take 3 capsules (900 mg total) by mouth at bedtime., Disp: 270 capsule, Rfl: 1 Medication Side Effects: chest pain, with Lybalvi        Family Medical/ Social History: Changes?  No  MENTAL HEALTH EXAM:  There were no vitals taken for this visit.There is no height or weight on file to calculate BMI.  General Appearance: Casual and Well Groomed  Eye Contact:  Good  Speech:  Clear and Coherent and Normal Rate  Volume:  Normal  Mood:  sad   Affect:  Congruent  Thought Process:  Goal Directed and Descriptions of Associations: Circumstantial  Orientation:  Full (Time, Place, and Person)  Thought Content: Logical   Suicidal Thoughts:  No  Homicidal Thoughts:  No  Memory:  WNL  Judgement:  Good  Insight:  Good  Psychomotor Activity:  Normal  Concentration:  Concentration: Good and Attention Span: Good  Recall:  Good  Fund of Knowledge: Good  Language: Good  Assets:  Desire for Improvement Financial Resources/Insurance Housing Resilience Transportation Vocational/Educational  ADL's:  Intact  Cognition: WNL  Prognosis:  Good   Labs 03/11/2024 TSH 2.370 Lithium level 0.5 CMP glucose 125, BUN 13, creatinine 1.18  DIAGNOSES:    ICD-10-CM   1. Mixed bipolar I disorder (HCC)  F31.60 Lithium level    2. Generalized anxiety disorder  F41.1     3. Encounter for long-term (current) use of medications  Z79.899 Lithium level     Receiving Psychotherapy: No   RECOMMENDATIONS:  PDMP reviewed.  Gabapentin  filled 02/15/2024.  Xanax  filled 11/23/2023. I provided approximately 30 minutes of face to face time during this encounter, including time spent before and after the visit in records review, medical decision making, counseling pertinent to today's visit, and charting.   I think he is responding to the lithium but we need to increase the dose.  He understands and agrees.  He needs to stay out of work longer than we had originally planned.  He is not mentally capable to safely do his job at [employer].  Tentatively RTW 03/23/2024.  Continue good self-care including healthy diet and exercise.  Continue Xanax  0.5 mg, 1/2-1 twice daily as needed. Continue Tegretol  XR 100 mg, 1 p.o. nightly. Continue gabapentin  400 mg, 1 p.o. twice daily. Continue Emgality  monthly per neurology. Continue Lithium 300 mg, 3 p.o. nightly (this was increased to this dose yesterday.) Continue Nurtec per neurology. Lab ordered for  03/21/2024. Recommend counseling. Return in 4 weeks.    Verneita Cooks, PA-C

## 2024-03-16 ENCOUNTER — Ambulatory Visit: Admitting: Physician Assistant

## 2024-03-21 ENCOUNTER — Other Ambulatory Visit: Payer: Self-pay | Admitting: Physician Assistant

## 2024-03-22 ENCOUNTER — Ambulatory Visit: Payer: Self-pay | Admitting: Physician Assistant

## 2024-03-22 LAB — LITHIUM LEVEL: Lithium Lvl: 0.8 mmol/L (ref 0.5–1.2)

## 2024-03-22 NOTE — Progress Notes (Signed)
 Please let him know that the Lucillie level is good, continue the same dose.

## 2024-04-15 ENCOUNTER — Encounter: Payer: Self-pay | Admitting: Physician Assistant

## 2024-04-15 ENCOUNTER — Ambulatory Visit: Admitting: Physician Assistant

## 2024-04-15 DIAGNOSIS — Z79899 Other long term (current) drug therapy: Secondary | ICD-10-CM

## 2024-04-15 DIAGNOSIS — F3160 Bipolar disorder, current episode mixed, unspecified: Secondary | ICD-10-CM | POA: Diagnosis not present

## 2024-04-15 DIAGNOSIS — F411 Generalized anxiety disorder: Secondary | ICD-10-CM | POA: Diagnosis not present

## 2024-04-15 NOTE — Progress Notes (Signed)
 [facility]  Patient ID: Gregory Howe,  MRN: [id number]  PCP: Mavis Coy  Date of Evaluation: 04/15/2024 Time spent:30 minutes  Chief Complaint:  Chief Complaint   Follow-up    HISTORY/CURRENT STATUS: For 4 week f/u  Lithium has helped 'those surges better than anything else ever has. He doesn't get angry or frustrated at home, it's just the job. I know it'll be better once I retire.  He's back at work now and it's going ok.  Energy and motivation are good.   No extreme sadness, tearfulness, or feelings of hopelessness.  Sleeps well. ADLs and personal hygiene are normal.   Denies any changes in concentration, making decisions, or remembering things.  Appetite has not changed.   No mania, delirium, AH/VH.  No SI/HI.  He feels a tremor on the inside, feels jittery at times and has a mild tremor in his hands. Doesn't affect writing, eating, etc.  He feels like it may be worse since starting the Lithium. He just wanted to mention it.  Doesn't feel like it's bad enough to change the med.   Individual Medical History/ Review of Systems: Changes? :No      Past medications for mental health diagnoses include: Latuda, Depakote caused inc ammonia, Lexapro, Lamictal, Ambien, Risperdal caused nightmares,  Xanax , Tegretol  was effective.  Seroquel caused severe sedation, Zoloft, gabapentin  at higher doses caused blurred vision and dizziness. Modafinil  caused dizziness and headaches. Trileptal  caused nausea and severe h/a. Paxil was awful.'  Zyprexa , Lybalvi   Allergies: Trileptal  [oxcarbazepine ], Lamictal [lamotrigine], and Modafinil   Current Medications:  Current Outpatient Medications:    ALPRAZolam  (XANAX ) 0.5 MG tablet, Take 1 tablet (0.5 mg total) by mouth 2 (two) times daily as needed. for anxiety, Disp: 180 tablet, Rfl: 1   baclofen (LIORESAL) 10 MG tablet, Take 10 mg by mouth 3 (three) times daily as needed., Disp: , Rfl:    carbamazepine  (TEGRETOL  XR) 100 MG 12 hr  tablet, Take 1 tablet (100 mg total) by mouth at bedtime., Disp: , Rfl:    Cholecalciferol (VITAMIN D3) 125 MCG (5000 UT) CAPS, Take 1 tablet by mouth daily., Disp: , Rfl:    gabapentin  (NEURONTIN ) 400 MG capsule, Take 1 capsule (400 mg total) by mouth 2 (two) times daily., Disp: 180 capsule, Rfl: 1   Galcanezumab -gnlm (EMGALITY ) 120 MG/ML SOAJ, Inject 120 mg into the skin every 30 (thirty) days., Disp: 1.12 mL, Rfl: 11   lisinopril (PRINIVIL,ZESTRIL) 5 MG tablet, , Disp: , Rfl:    lithium carbonate 300 MG capsule, Take 3 capsules (900 mg total) by mouth at bedtime., Disp: 270 capsule, Rfl: 1   meloxicam (MOBIC) 15 MG tablet, Take by mouth., Disp: , Rfl:    metFORMIN (GLUCOPHAGE) 500 MG tablet, Take 500 mg by mouth 2 (two) times daily with a meal. Metformin ER- 2 in the am, 2 in the pm, Disp: , Rfl:    metoprolol tartrate (LOPRESSOR) 50 MG tablet, 1 tablet in the am, 1/2 tablet in pm, Disp: , Rfl:    Multiple Vitamin (MULTIVITAMIN) tablet, Take 1 tablet by mouth daily., Disp: , Rfl:    Omega-3 Fatty Acids (OMEGA 3 500 PO), Take 500 mg by mouth., Disp: , Rfl:    Rimegepant Sulfate (NURTEC) 75 MG TBDP, Take 1 tablet (75 mg total) by mouth daily as needed (Take 1 tablet at onset of migraine(do NOT exceed more than 1 tablet in 24 hrs))., Disp: 8 tablet, Rfl: 5 Medication Side Effects: chest pain, with Lybalvi   Family Medical/ Social History: Changes?  No  MENTAL HEALTH EXAM:  There were no vitals taken for this visit.There is no height or weight on file to calculate BMI.  General Appearance: Casual and Well Groomed  Eye Contact:  Good  Speech:  Clear and Coherent and Normal Rate  Volume:  Normal  Mood:  Euthymic  Affect:  Congruent  Thought Process:  Goal Directed and Descriptions of Associations: Circumstantial  Orientation:  Full (Time, Place, and Person)  Thought Content: Logical   Suicidal Thoughts:  No  Homicidal Thoughts:  No  Memory:  WNL  Judgement:  Good  Insight:  Good   Psychomotor Activity:  Normal  Concentration:  Concentration: Good and Attention Span: Good  Recall:  Good  Fund of Knowledge: Good  Language: Good  Assets:  Desire for Improvement Financial Resources/Insurance Housing Resilience Transportation Vocational/Educational  ADL's:  Intact  Cognition: WNL  Prognosis:  Good   Labs 03/21/2024. Lithium level 0.8  DIAGNOSES:    ICD-10-CM   1. Mixed bipolar I disorder (HCC)  F31.60 Lithium level    2. Encounter for long-term (current) use of medications  Z79.899 Lithium level    3. Generalized anxiety disorder  F41.1       Receiving Psychotherapy: No   RECOMMENDATIONS:  PDMP reviewed.  Gabapentin  filled 02/15/2024.  Xanax  filled 11/23/2023. I provided approximately  30  minutes of face to face time during this encounter, including time spent before and after the visit in records review, medical decision making, counseling pertinent to today's visit, and charting.   He's doing better so no changes are needed. Let me know if the tremor worsens.   Continue Xanax  0.5 mg, 1/2-1 twice daily as needed. Continue Tegretol  XR 100 mg, 1 p.o. nightly. Continue gabapentin  400 mg, 1 p.o. twice daily. Continue Emgality  monthly per neurology. Continue Lithium 300 mg, 3 p.o. nightly. Continue Nurtec per neurology. Get lab drawn around a week before OV. Return in 2 months.   Verneita Cooks, PA-C

## 2024-05-03 ENCOUNTER — Telehealth: Payer: Self-pay | Admitting: Physician Assistant

## 2024-05-03 ENCOUNTER — Other Ambulatory Visit: Payer: Self-pay

## 2024-05-03 DIAGNOSIS — F411 Generalized anxiety disorder: Secondary | ICD-10-CM

## 2024-05-03 MED ORDER — GABAPENTIN 400 MG PO CAPS: 400.0000 mg | ORAL_CAPSULE | Freq: Two times a day (BID) | ORAL | 1 refills | Status: AC

## 2024-05-03 NOTE — Telephone Encounter (Signed)
 Pended with Dr. Calhoun DEA #.

## 2024-05-03 NOTE — Telephone Encounter (Signed)
 Gregory Howe, pharmacist at [facility] Rx LVM @ 1:23p stating that the Gabapentin  is a controlled script in [place] .  Since pt's address is in [place], they need a new script sent over with the DEA number included.  Ref# [id number]  Next appt 1/16

## 2024-06-07 ENCOUNTER — Encounter: Payer: Self-pay | Admitting: Physician Assistant

## 2024-06-17 ENCOUNTER — Encounter: Payer: Self-pay | Admitting: Physician Assistant

## 2024-06-17 ENCOUNTER — Ambulatory Visit: Admitting: Physician Assistant

## 2024-06-17 DIAGNOSIS — Z79899 Other long term (current) drug therapy: Secondary | ICD-10-CM | POA: Diagnosis not present

## 2024-06-17 DIAGNOSIS — F319 Bipolar disorder, unspecified: Secondary | ICD-10-CM | POA: Diagnosis not present

## 2024-06-17 DIAGNOSIS — G4701 Insomnia due to medical condition: Secondary | ICD-10-CM | POA: Diagnosis not present

## 2024-06-17 DIAGNOSIS — F411 Generalized anxiety disorder: Secondary | ICD-10-CM | POA: Diagnosis not present

## 2024-06-17 DIAGNOSIS — R5383 Other fatigue: Secondary | ICD-10-CM

## 2024-06-17 DIAGNOSIS — R5381 Other malaise: Secondary | ICD-10-CM

## 2024-06-17 MED ORDER — ALPRAZOLAM 0.5 MG PO TABS: 0.5000 mg | ORAL_TABLET | Freq: Two times a day (BID) | ORAL | 1 refills | Status: AC | PRN

## 2024-06-17 MED ORDER — CARBAMAZEPINE ER 100 MG PO TB12: 100.0000 mg | ORAL_TABLET | Freq: Every evening | ORAL | 1 refills | Status: AC

## 2024-06-17 NOTE — Progress Notes (Signed)
 "     [facility]  Patient ID: Gregory Howe,  MRN: [id number]  PCP: Mavis Coy  Date of Evaluation: 06/17/2024 Time spent:30 minutes  Chief Complaint:  Chief Complaint   Anxiety; Depression; Follow-up    HISTORY/CURRENT STATUS: For routine med check.  He's doing well for the most part.  He didn't increase the Lithium  b/c it makes him way too drowsy the next day.  He feels like it's effective. Doesn't have those 'surges' of anger like he did.  No reports of increased talkativeness, racing thoughts, impulsivity or risky behaviors, increased spending, grandiosity, paranoia, or hallucinations.  Patient is able to enjoy things. He and his wife have been traveling a lot and enjoyed it.  Energy and motivation are good.  Work is stressful as always.  No extreme sadness, tearfulness, or feelings of hopelessness.  Sleeps well. ADLs and personal hygiene are normal.   Denies any changes in concentration, making decisions, or remembering things.  Appetite has not changed.  Anxiety is about the same.  Xanax  is helpful.  No SI/HI.  Individual Medical History/ Review of Systems: Changes? :No      Past medications for mental health diagnoses include: Latuda, Depakote caused inc ammonia, Lexapro, Lamictal, Ambien, Risperdal caused nightmares,  Xanax , Tegretol  was effective.  Seroquel caused severe sedation, Zoloft, gabapentin  at higher doses caused blurred vision and dizziness. Modafinil  caused dizziness and headaches. Trileptal  caused nausea and severe h/a. Paxil was awful.'  Zyprexa , Lybalvi   Allergies: Trileptal  [oxcarbazepine ], Lamictal [lamotrigine], and Modafinil   Current Medications:  Current Outpatient Medications:    baclofen (LIORESAL) 10 MG tablet, Take 10 mg by mouth 3 (three) times daily as needed., Disp: , Rfl:    Cholecalciferol (VITAMIN D3) 125 MCG (5000 UT) CAPS, Take 1 tablet by mouth daily., Disp: , Rfl:    gabapentin  (NEURONTIN ) 400 MG capsule, Take 1 capsule (400 mg  total) by mouth 2 (two) times daily., Disp: 180 capsule, Rfl: 1   Galcanezumab -gnlm (EMGALITY ) 120 MG/ML SOAJ, Inject 120 mg into the skin every 30 (thirty) days., Disp: 1.12 mL, Rfl: 11   lisinopril (PRINIVIL,ZESTRIL) 5 MG tablet, , Disp: , Rfl:    lithium  carbonate 300 MG capsule, Take 3 capsules (900 mg total) by mouth at bedtime. (Patient taking differently: Take 600 mg by mouth at bedtime.), Disp: 270 capsule, Rfl: 1   meloxicam (MOBIC) 15 MG tablet, Take by mouth., Disp: , Rfl:    metFORMIN (GLUCOPHAGE) 500 MG tablet, Take 500 mg by mouth 2 (two) times daily with a meal. Metformin ER- 2 in the am, 2 in the pm, Disp: , Rfl:    metoprolol tartrate (LOPRESSOR) 50 MG tablet, 1 tablet in the am, 1/2 tablet in pm, Disp: , Rfl:    Multiple Vitamin (MULTIVITAMIN) tablet, Take 1 tablet by mouth daily., Disp: , Rfl:    Omega-3 Fatty Acids (OMEGA 3 500 PO), Take 500 mg by mouth., Disp: , Rfl:    Rimegepant Sulfate (NURTEC) 75 MG TBDP, Take 1 tablet (75 mg total) by mouth daily as needed (Take 1 tablet at onset of migraine(do NOT exceed more than 1 tablet in 24 hrs))., Disp: 8 tablet, Rfl: 5   ALPRAZolam  (XANAX ) 0.5 MG tablet, Take 1 tablet (0.5 mg total) by mouth 2 (two) times daily as needed. for anxiety, Disp: 180 tablet, Rfl: 1   carbamazepine  (TEGRETOL  XR) 100 MG 12 hr tablet, Take 1 tablet (100 mg total) by mouth at bedtime., Disp: 90 tablet, Rfl: 1 Medication Side Effects:  chest pain, with Lybalvi        Family Medical/ Social History: Changes?  No  MENTAL HEALTH EXAM:  There were no vitals taken for this visit.There is no height or weight on file to calculate BMI.  General Appearance: Casual and Well Groomed  Eye Contact:  Good  Speech:  Clear and Coherent and Normal Rate  Volume:  Normal  Mood:  Euthymic  Affect:  Congruent  Thought Process:  Goal Directed and Descriptions of Associations: Circumstantial  Orientation:  Full (Time, Place, and Person)  Thought Content: Logical   Suicidal  Thoughts:  No  Homicidal Thoughts:  No  Memory:  WNL  Judgement:  Good  Insight:  Good  Psychomotor Activity:  Normal  Concentration:  Concentration: Good and Attention Span: Good  Recall:  Good  Fund of Knowledge: Good  Language: Good  Assets:  Communication Skills Desire for Improvement Financial Resources/Insurance Housing Resilience Social Support Transportation Vocational/Educational  ADL's:  Intact  Cognition: WNL  Prognosis:  Good   Labs 06/06/2024 Lithium  level 0.5  DIAGNOSES:    ICD-10-CM   1. Bipolar I disorder (HCC)  F31.9 Carbamazepine  level, total    CBC with Differential/Platelet    Comprehensive metabolic panel    TSH    VITAMIN D  25 Hydroxy (Vit-D Deficiency, Fractures)    B12 and Folate Panel    2. Generalized anxiety disorder  F41.1     3. Insomnia due to medical condition  G47.01     4. Malaise and fatigue  R53.81 Carbamazepine  level, total   R53.83 CBC with Differential/Platelet    Comprehensive metabolic panel    TSH    VITAMIN D  25 Hydroxy (Vit-D Deficiency, Fractures)    B12 and Folate Panel    5. Encounter for long-term (current) use of medications  Z79.899 Carbamazepine  level, total    CBC with Differential/Platelet    Comprehensive metabolic panel    TSH    VITAMIN D  25 Hydroxy (Vit-D Deficiency, Fractures)    B12 and Folate Panel     Receiving Psychotherapy: No   RECOMMENDATIONS:  PDMP reviewed.  Gabapentin  filled 05/16/2024.  Xanax  filled 04/26/2024. I provided approximately  30  minutes of face to face time during this encounter, including time spent before and after the visit in records review, medical decision making, counseling pertinent to today's visit, and charting.   The Li level is lower than I'd like, but he's doing well at the current dose and increasig it caused too much sedation.  We agree to leave all meds the same.   Continue Xanax  0.5 mg, 1/2-1 twice daily as needed. Continue Tegretol  XR 100 mg, 1 p.o.  nightly. Continue gabapentin  400 mg, 1 p.o. twice daily. Continue Emgality  monthly per neurology. Continue Lithium  300 mg, 2 p.o. nightly. Continue Nurtec per neurology. Labs ordered as above. Have drawn sometime in the next few weeks.  Return in 3 months.   Verneita Cooks, PA-C  "

## 2024-09-20 ENCOUNTER — Ambulatory Visit: Admitting: Physician Assistant

## 2025-02-21 ENCOUNTER — Ambulatory Visit: Admitting: Neurology
